# Patient Record
Sex: Female | Born: 1995 | Race: Black or African American | Hispanic: No | Marital: Single | Smoking: Former smoker
Health system: Southern US, Community
[De-identification: ages and names within clinical notes are randomized; demographics above are authoritative.]

## PROBLEM LIST (undated history)

## (undated) ENCOUNTER — Inpatient Hospital Stay (HOSPITAL_COMMUNITY): Payer: Self-pay

## (undated) DIAGNOSIS — A549 Gonococcal infection, unspecified: Secondary | ICD-10-CM

## (undated) DIAGNOSIS — A749 Chlamydial infection, unspecified: Secondary | ICD-10-CM

## (undated) DIAGNOSIS — J45909 Unspecified asthma, uncomplicated: Secondary | ICD-10-CM

## (undated) HISTORY — PX: WISDOM TOOTH EXTRACTION: SHX21

---
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
## 2016-05-04 ENCOUNTER — Encounter (HOSPITAL_COMMUNITY): Payer: Self-pay | Admitting: Emergency Medicine

## 2016-05-04 ENCOUNTER — Ambulatory Visit (HOSPITAL_COMMUNITY)
Admission: EM | Admit: 2016-05-04 | Discharge: 2016-05-04 | Disposition: A | Discharge status: Home / Self Care | Payer: 59 | Attending: Family Medicine | Admitting: Family Medicine

## 2016-05-04 DIAGNOSIS — Z113 Encounter for screening for infections with a predominantly sexual mode of transmission: Secondary | ICD-10-CM

## 2016-05-04 DIAGNOSIS — N898 Other specified noninflammatory disorders of vagina: Secondary | ICD-10-CM | POA: Diagnosis not present

## 2016-05-04 DIAGNOSIS — Z3202 Encounter for pregnancy test, result negative: Secondary | ICD-10-CM | POA: Diagnosis not present

## 2016-05-04 DIAGNOSIS — R110 Nausea: Secondary | ICD-10-CM

## 2016-05-04 DIAGNOSIS — Z202 Contact with and (suspected) exposure to infections with a predominantly sexual mode of transmission: Secondary | ICD-10-CM

## 2016-05-04 LAB — CBC WITH DIFFERENTIAL/PLATELET
Basophils Absolute: 0 10*3/uL (ref 0.0–0.1)
Basophils Relative: 0 %
EOS PCT: 1 %
Eosinophils Absolute: 0.1 10*3/uL (ref 0.0–0.7)
HEMATOCRIT: 41.5 % (ref 36.0–46.0)
HEMOGLOBIN: 13.6 g/dL (ref 12.0–15.0)
LYMPHS ABS: 3.3 10*3/uL (ref 0.7–4.0)
LYMPHS PCT: 43 %
MCH: 28.3 pg (ref 26.0–34.0)
MCHC: 32.8 g/dL (ref 30.0–36.0)
MCV: 86.5 fL (ref 78.0–100.0)
Monocytes Absolute: 0.4 10*3/uL (ref 0.1–1.0)
Monocytes Relative: 6 %
NEUTROS ABS: 3.8 10*3/uL (ref 1.7–7.7)
Neutrophils Relative %: 50 %
Platelets: 249 10*3/uL (ref 150–400)
RBC: 4.8 MIL/uL (ref 3.87–5.11)
RDW: 14.4 % (ref 11.5–15.5)
WBC: 7.6 10*3/uL (ref 4.0–10.5)

## 2016-05-04 LAB — POCT I-STAT, CHEM 8
BUN: 13 mg/dL (ref 6–20)
CHLORIDE: 105 mmol/L (ref 101–111)
CREATININE: 0.7 mg/dL (ref 0.44–1.00)
Calcium, Ion: 1.09 mmol/L — ABNORMAL LOW (ref 1.15–1.40)
GLUCOSE: 79 mg/dL (ref 65–99)
HEMATOCRIT: 44 % (ref 36.0–46.0)
HEMOGLOBIN: 15 g/dL (ref 12.0–15.0)
POTASSIUM: 4.2 mmol/L (ref 3.5–5.1)
Sodium: 138 mmol/L (ref 135–145)
TCO2: 25 mmol/L (ref 0–100)

## 2016-05-04 LAB — POCT PREGNANCY, URINE: Preg Test, Ur: NEGATIVE

## 2016-05-04 MED ORDER — METRONIDAZOLE 500 MG PO TABS
500.0000 mg | ORAL_TABLET | Freq: Two times a day (BID) | ORAL | 0 refills | Status: DC
Start: 2016-05-04 — End: 2016-06-06

## 2016-05-04 MED ORDER — STERILE WATER FOR INJECTION IJ SOLN
INTRAMUSCULAR | Status: AC
  Filled 2016-05-04: qty 10

## 2016-05-04 MED ORDER — PENICILLIN G BENZATHINE 1200000 UNIT/2ML IM SUSP
2.4000 10*6.[IU] | Freq: Once | INTRAMUSCULAR | Status: AC
  Administered 2016-05-04: 2.4 10*6.[IU] via INTRAMUSCULAR

## 2016-05-04 MED ORDER — PENICILLIN G BENZATHINE 1200000 UNIT/2ML IM SUSP
INTRAMUSCULAR | Status: AC
  Filled 2016-05-04: qty 2

## 2016-05-04 MED ORDER — CEFTRIAXONE SODIUM 250 MG IJ SOLR
250.0000 mg | Freq: Once | INTRAMUSCULAR | Status: AC
  Administered 2016-05-04: 250 mg via INTRAMUSCULAR

## 2016-05-04 MED ORDER — AZITHROMYCIN 250 MG PO TABS
1000.0000 mg | ORAL_TABLET | Freq: Once | ORAL | Status: AC
  Administered 2016-05-04: 1000 mg via ORAL

## 2016-05-04 MED ORDER — AZITHROMYCIN 250 MG PO TABS
ORAL_TABLET | ORAL | Status: AC
  Filled 2016-05-04: qty 4

## 2016-05-04 MED ORDER — CEFTRIAXONE SODIUM 250 MG IJ SOLR
INTRAMUSCULAR | Status: AC
  Filled 2016-05-04: qty 250

## 2016-05-04 NOTE — ED Provider Notes (Signed)
CSN: [id number]     Arrival date & time 05/04/16  1710 History   First MD Initiated Contact with Patient 05/04/16 1734     Chief Complaint  Patient presents with  . Exposure to STD   (Consider location/radiation/quality/duration/timing/severity/associated sxs/prior Treatment) 21 year old female presents to clinic with a chief complaint of possible exposure to syphilis. She states one of her previous partners has tested positive for syphilis. She states she wants to be "tested for everything" she states she has no vaginal discharge, or discomfort, however she has had some nausea recently, chills, and fatigue. She does not believe there is a possibility of pregnancy, and she takes Depo injections for birth control.   The history is provided by the patient.  Exposure to STD  The current episode started more than 2 days ago. The problem occurs rarely. The problem has not changed since onset.Pertinent negatives include no chest pain, no abdominal pain, no headaches and no shortness of breath. Nothing aggravates the symptoms. She has tried nothing for the symptoms.    Past Medical History:  Diagnosis Date  . Asthma    History reviewed. No pertinent surgical history. Family History  Problem Relation Age of Onset  . Hypertension Father    Social History  Substance Use Topics  . Smoking status: Never Smoker  . Smokeless tobacco: Never Used  . Alcohol use Yes     Comment: occasional   OB History    No data available     Review of Systems  Constitutional: Positive for chills and fatigue. Negative for appetite change and fever.  HENT: Negative.   Respiratory: Negative for shortness of breath.   Cardiovascular: Negative for chest pain, palpitations and leg swelling.  Gastrointestinal: Positive for nausea. Negative for abdominal pain, constipation and vomiting.  Genitourinary: Negative for difficulty urinating, dyspareunia, dysuria, flank pain, frequency, genital sores, pelvic pain,  urgency, vaginal discharge and vaginal pain.  Musculoskeletal: Negative for arthralgias, back pain, myalgias, neck pain and neck stiffness.  Skin: Negative for color change, pallor and wound.  Neurological: Negative for dizziness, syncope, weakness, numbness and headaches.  Hematological: Negative for adenopathy. Does not bruise/bleed easily.    Allergies  Patient has no known allergies.  Home Medications   Prior to Admission medications   Medication Sig Start Date End Date Taking? Authorizing Provider  albuterol (PROVENTIL HFA;VENTOLIN HFA) 108 (90 BASE) MCG/ACT inhaler Inhale 2 puffs into the lungs every 4 (four) hours as needed for wheezing or shortness of breath. 04/18/13  Yes Reuben Likes, MD  Norethin-Eth Estradiol-Fe (ZENCHENT FE PO) Take 1 tablet by mouth daily.   Yes Historical Provider, MD  desogestrel-ethinyl estradiol (APRI,EMOQUETTE,SOLIA) 0.15-30 MG-MCG tablet Take 1 tablet by mouth daily.    Historical Provider, MD  ibuprofen (ADVIL,MOTRIN) 800 MG tablet Take 800 mg by mouth every 8 (eight) hours as needed for mild pain.    Historical Provider, MD  medroxyPROGESTERone (DEPO-PROVERA) 150 MG/ML injection Inject 150 mg into the muscle every 3 (three) months.    Historical Provider, MD  metroNIDAZOLE (FLAGYL) 500 MG tablet Take 1 tablet (500 mg total) by mouth 2 (two) times daily. 05/04/16   Dorena Bodo, NP  naproxen (NAPROSYN) 500 MG tablet Take 1 tablet (500 mg total) by mouth 2 (two) times daily. 01/13/14   Antony Madura, PA-C  triamcinolone cream (KENALOG) 0.1 % Apply 1 application topically 2 (two) times daily. 05/16/14   Rodolph Bong, MD   Meds Ordered and Administered this Visit   Medications  penicillin g benzathine (BICILLIN LA) 1200000 UNIT/2ML injection 2.4 Million Units (2.4 Million Units Intramuscular Given 05/04/16 1815)  azithromycin (ZITHROMAX) tablet 1,000 mg (1,000 mg Oral Given 05/04/16 1813)  cefTRIAXone (ROCEPHIN) injection 250 mg (250 mg Intramuscular Given  05/04/16 1813)    BP 121/69   Pulse (!) 58   Temp 98.7 F (37.1 C) (Oral)   Resp 16   Ht  (1.6 m)   Wt 130 lb (59 kg)   SpO2 100%   BMI 23.03 kg/m  No data found.   Physical Exam  Constitutional: She is oriented to person, place, and time. She appears well-developed and well-nourished. No distress.  HENT:  Head: Normocephalic and atraumatic.  Right Ear: External ear normal.  Left Ear: External ear normal.  Cardiovascular: Normal rate and regular rhythm.   Pulmonary/Chest: Effort normal and breath sounds normal.  Abdominal: Soft. Bowel sounds are normal. She exhibits no distension. There is no tenderness. There is no guarding.  Genitourinary: Pelvic exam was performed with patient supine. There is no tenderness or lesion on the right labia. There is no tenderness or lesion on the left labia. Cervix exhibits discharge. Cervix exhibits no motion tenderness and no friability. Right adnexum displays no mass, no tenderness and no fullness. Left adnexum displays no mass, no tenderness and no fullness. No erythema or bleeding in the vagina. No foreign body in the vagina. Vaginal discharge found.  Genitourinary Comments: Thick, white, mucopurulent discharge found in vaginal area, along with the cervix. No lesions, rashes, or other external signs or symptoms of syphilis were noted on exam.  Lymphadenopathy:       Right: No inguinal adenopathy present.       Left: No inguinal adenopathy present.  Neurological: She is alert and oriented to person, place, and time.  Skin: Skin is warm and dry. Capillary refill takes less than 2 seconds. She is not diaphoretic.  Psychiatric: She has a normal mood and affect. Her behavior is normal.  Nursing note and vitals reviewed.   Urgent Care Course     Procedures (including critical care time)  Labs Review Labs Reviewed  POCT I-STAT, CHEM 8 - Abnormal; Notable for the following:       Result Value   Calcium, Ion 1.09 (*)    All other  components within normal limits  URINE CULTURE  CBC WITH DIFFERENTIAL/PLATELET  RPR  HIV ANTIBODY (ROUTINE TESTING)  POCT PREGNANCY, URINE  CERVICOVAGINAL ANCILLARY ONLY    Imaging Review No results found.     MDM   1. STD exposure    Pregnancy test negative, UA was unable to be run through the machine due to exceeding the limits of the equipment, positive for ketones, glucose, trace blood, negative nitrites, positive for protein, negative for leukocytes based off visual reading of the strip.  Treating empirically for syphilis, given injection of 2.4 million units of penicillin G, based on findings a physical exam also treating with ceftriaxone and Rocephin. We'll discharge home on prescription of metronidazole as well.  RPR, HIV, as well as samples sent for gonorrhea, chlamydia, and wet prep were sent to lab, CBC, and i-STAT also obtained as well. CBC and I-STAT findings were within normal limits except for calcium, which was 1.09.     Dorena Bodo, NP 05/04/16 2032

## 2016-05-04 NOTE — ED Triage Notes (Signed)
PT reports she had unprotected sex with someone who believes they now have syphilis. PT states, "I want to be tested for everything." PT is asymptomatic.

## 2016-05-04 NOTE — Discharge Instructions (Signed)
You have been treated today for syphilis, gonorrhea, chlamydia, samples that were collected today will be sent to lab for testing, and you will be notified of these results in 3-5 business days. I recommend safe sex practices rather abstinence, or use other barrier methods such as a condom. Also to cover for the possibility of bacterial vaginosis, I prescribed metronidazole, take one tablet twice a day for 7 days, and do not drink any alcohol while taking as this will make you very ill.  I have proved the contact information for a behavior health provider as you have requested. If at anytime you have any thoughts of self harm, please call 911, or go to the emergency room as soon as possible.

## 2016-05-04 NOTE — ED Notes (Signed)
Urinalysis test, urine levels exceeded the limits of the clinitex capabilities

## 2016-05-05 LAB — RPR: RPR Ser Ql: NONREACTIVE

## 2016-05-05 LAB — CERVICOVAGINAL ANCILLARY ONLY
Chlamydia: NEGATIVE
Neisseria Gonorrhea: NEGATIVE
Wet Prep (BD Affirm): NEGATIVE

## 2016-05-05 LAB — HIV ANTIBODY (ROUTINE TESTING W REFLEX): HIV SCREEN 4TH GENERATION: NONREACTIVE

## 2016-05-06 LAB — URINE CULTURE

## 2016-06-06 ENCOUNTER — Encounter (HOSPITAL_COMMUNITY): Payer: Self-pay | Admitting: Emergency Medicine

## 2016-06-06 ENCOUNTER — Ambulatory Visit (HOSPITAL_COMMUNITY)
Admission: EM | Admit: 2016-06-06 | Discharge: 2016-06-06 | Disposition: A | Discharge status: Home / Self Care | Payer: 59 | Attending: Family Medicine | Admitting: Family Medicine

## 2016-06-06 DIAGNOSIS — G44209 Tension-type headache, unspecified, not intractable: Secondary | ICD-10-CM

## 2016-06-06 MED ORDER — DEXAMETHASONE SODIUM PHOSPHATE 10 MG/ML IJ SOLN
INTRAMUSCULAR | Status: AC
  Filled 2016-06-06: qty 1

## 2016-06-06 MED ORDER — ONDANSETRON 4 MG PO TBDP
ORAL_TABLET | ORAL | Status: AC
  Filled 2016-06-06: qty 1

## 2016-06-06 MED ORDER — DEXAMETHASONE SODIUM PHOSPHATE 10 MG/ML IJ SOLN
10.0000 mg | Freq: Once | INTRAMUSCULAR | Status: AC
  Administered 2016-06-06: 10 mg via INTRAMUSCULAR

## 2016-06-06 MED ORDER — NAPROXEN 500 MG PO TABS: 500.0000 mg | ORAL_TABLET | Freq: Two times a day (BID) | ORAL | 0 refills | Status: DC

## 2016-06-06 MED ORDER — KETOROLAC TROMETHAMINE 60 MG/2ML IM SOLN
INTRAMUSCULAR | Status: AC
  Filled 2016-06-06: qty 2

## 2016-06-06 MED ORDER — ONDANSETRON 4 MG PO TBDP
4.0000 mg | ORAL_TABLET | Freq: Once | ORAL | Status: AC
  Administered 2016-06-06: 4 mg via ORAL

## 2016-06-06 MED ORDER — KETOROLAC TROMETHAMINE 60 MG/2ML IM SOLN
60.0000 mg | Freq: Once | INTRAMUSCULAR | Status: AC
  Administered 2016-06-06: 60 mg via INTRAMUSCULAR

## 2016-06-06 NOTE — ED Triage Notes (Signed)
The patient presented to the [facility] with a complaint of a headache x 2 days. 

## 2016-06-06 NOTE — ED Provider Notes (Signed)
CSN: [id number]     Arrival date & time 06/06/16  1937 History   None    Chief Complaint  Patient presents with  . Headache   (Consider location/radiation/quality/duration/timing/severity/associated sxs/prior Treatment) Patient c/o headache for last 2 days.  Patient c/o headache that wraps around, nausea, photophonophobia.   The history is provided by the patient.  Headache  Pain location:  Generalized Severity currently:  5/10 Severity at highest:  5/10 Onset quality:  Sudden Timing:  Constant Progression:  Worsening Chronicity:  New Context: activity   Relieved by:  Nothing Worsened by:  Nothing   Past Medical History:  Diagnosis Date  . Asthma    History reviewed. No pertinent surgical history. Family History  Problem Relation Age of Onset  . Hypertension Father    Social History  Substance Use Topics  . Smoking status: Never Smoker  . Smokeless tobacco: Never Used  . Alcohol use Yes     Comment: occasional   OB History    No data available     Review of Systems  Constitutional: Negative.   HENT: Negative.   Eyes: Negative.   Respiratory: Negative.   Cardiovascular: Negative.   Gastrointestinal: Negative.   Endocrine: Negative.   Genitourinary: Negative.   Musculoskeletal: Negative.   Allergic/Immunologic: Negative.   Neurological: Positive for headaches.  Hematological: Negative.   Psychiatric/Behavioral: Negative.     Allergies  Patient has no known allergies.  Home Medications   Prior to Admission medications   Medication Sig Start Date End Date Taking? Authorizing Provider  naproxen (NAPROSYN) 500 MG tablet Take 1 tablet (500 mg total) by mouth 2 (two) times daily with a meal. 06/06/16   Deatra CanterWilliam J Gaddiel Cullens, FNP  naproxen (NAPROSYN) 500 MG tablet Take 1 tablet (500 mg total) by mouth 2 (two) times daily with a meal. 06/06/16   Deatra CanterWilliam J Tristram Milian, FNP   Meds Ordered and Administered this Visit   Medications  ketorolac (TORADOL) injection 60 mg  (not administered)  ondansetron (ZOFRAN-ODT) disintegrating tablet 4 mg (not administered)  dexamethasone (DECADRON) injection 10 mg (not administered)    BP 124/68 (BP Location: Right Arm)   Pulse 90   Temp 98.3 F (36.8 C) (Oral)   Resp 18   SpO2 100%  No data found.   Physical Exam  Constitutional: She is oriented to person, place, and time. She appears well-developed and well-nourished.  HENT:  Head: Normocephalic and atraumatic.  Right Ear: External ear normal.  Left Ear: External ear normal.  Mouth/Throat: Oropharynx is clear and moist.  Eyes: Conjunctivae and EOM are normal. Pupils are equal, round, and reactive to light.  Neck: Normal range of motion. Neck supple.  Cardiovascular: Normal rate, regular rhythm and normal heart sounds.   Pulmonary/Chest: Effort normal and breath sounds normal.  Abdominal: Soft. Bowel sounds are normal.  Neurological: She is alert and oriented to person, place, and time.  Nursing note and vitals reviewed.   Urgent Care Course     Procedures (including critical care time)  Labs Review Labs Reviewed - No data to display  Imaging Review No results found.   Visual Acuity Review  Right Eye Distance:   Left Eye Distance:   Bilateral Distance:    Right Eye Near:   Left Eye Near:    Bilateral Near:         MDM   1. Tension headache    Toradol 60mg IM Zofran ODT 4mg  Dexamethasone 10mg  IM  Naprosyn 500mg  one po bid x 7  days #14      Deatra Canter, FNP 06/06/16 2112

## 2016-08-26 ENCOUNTER — Encounter (HOSPITAL_COMMUNITY): Payer: Self-pay | Admitting: Nurse Practitioner

## 2016-08-26 ENCOUNTER — Emergency Department (HOSPITAL_COMMUNITY): Payer: 59

## 2016-08-26 DIAGNOSIS — R079 Chest pain, unspecified: Secondary | ICD-10-CM | POA: Insufficient documentation

## 2016-08-26 DIAGNOSIS — R05 Cough: Secondary | ICD-10-CM | POA: Insufficient documentation

## 2016-08-26 DIAGNOSIS — R0602 Shortness of breath: Secondary | ICD-10-CM | POA: Diagnosis not present

## 2016-08-26 DIAGNOSIS — Z5321 Procedure and treatment not carried out due to patient leaving prior to being seen by health care provider: Secondary | ICD-10-CM | POA: Insufficient documentation

## 2016-08-26 LAB — BASIC METABOLIC PANEL
Anion gap: 6 (ref 5–15)
BUN: <5 mg/dL — ABNORMAL LOW (ref 6–20)
CALCIUM: 9.6 mg/dL (ref 8.9–10.3)
CO2: 23 mmol/L (ref 22–32)
CREATININE: 0.75 mg/dL (ref 0.44–1.00)
Chloride: 108 mmol/L (ref 101–111)
GFR calc Af Amer: >60 mL/min (ref >60)
GLUCOSE: 93 mg/dL (ref 65–99)
Potassium: 3.8 mmol/L (ref 3.5–5.1)
Sodium: 137 mmol/L (ref 135–145)

## 2016-08-26 LAB — I-STAT TROPONIN, ED: TROPONIN I, POC: 0 ng/mL (ref 0.00–0.08)

## 2016-08-26 LAB — CBC
HCT: 39.7 % (ref 36.0–46.0)
Hemoglobin: 13.4 g/dL (ref 12.0–15.0)
MCH: 28.6 pg (ref 26.0–34.0)
MCHC: 33.8 g/dL (ref 30.0–36.0)
MCV: 84.8 fL (ref 78.0–100.0)
Platelets: 283 10*3/uL (ref 150–400)
RBC: 4.68 MIL/uL (ref 3.87–5.11)
RDW: 14.2 % (ref 11.5–15.5)
WBC: 11.2 10*3/uL — ABNORMAL HIGH (ref 4.0–10.5)

## 2016-08-26 NOTE — ED Triage Notes (Signed)
Patient sts [place] Sunday [place] and noted she was having a cough, some shob and chest tightness. Pt sts symptoms have increased since being home. Pt endorses chest tightness when she takes a deep breath. Patient notes insect bite marks on arms, legs and chest that are itchy while she was in [place] but have spread since being here. Pt has not tried any OTC meds. Denies fevers, chills, rash, lightheadedness, dizziness, rashes. Pt In NAD.

## 2016-08-27 ENCOUNTER — Ambulatory Visit (HOSPITAL_COMMUNITY)
Admission: EM | Admit: 2016-08-27 | Discharge: 2016-08-27 | Disposition: A | Discharge status: Home / Self Care | Payer: 59 | Source: Home / Self Care | Attending: Family Medicine | Admitting: Family Medicine

## 2016-08-27 ENCOUNTER — Encounter (HOSPITAL_COMMUNITY): Payer: Self-pay | Admitting: Emergency Medicine

## 2016-08-27 ENCOUNTER — Emergency Department (HOSPITAL_COMMUNITY)
Admission: EM | Admit: 2016-08-27 | Discharge: 2016-08-27 | Disposition: A | Discharge status: Home / Self Care | Payer: 59 | Attending: Emergency Medicine | Admitting: Emergency Medicine

## 2016-08-27 DIAGNOSIS — J069 Acute upper respiratory infection, unspecified: Secondary | ICD-10-CM | POA: Diagnosis not present

## 2016-08-27 DIAGNOSIS — B9789 Other viral agents as the cause of diseases classified elsewhere: Secondary | ICD-10-CM | POA: Diagnosis not present

## 2016-08-27 MED ORDER — CETIRIZINE-PSEUDOEPHEDRINE ER 5-120 MG PO TB12: 1.0000 | ORAL_TABLET | Freq: Every day | ORAL | 0 refills | Status: DC

## 2016-08-27 MED ORDER — HYDROCOD POLST-CPM POLST ER 10-8 MG/5ML PO SUER: 5.0000 mL | Freq: Every evening | ORAL | 0 refills | Status: DC | PRN

## 2016-08-27 MED ORDER — FLUTICASONE PROPIONATE 50 MCG/ACT NA SUSP: 2.0000 | Freq: Every day | NASAL | 0 refills | Status: DC

## 2016-08-27 NOTE — ED Notes (Signed)
No answer when called  X 3 for reevaluayion

## 2016-08-27 NOTE — ED Provider Notes (Signed)
CSN: [id number]     Arrival date & time 08/27/16  1004 History   None    Chief Complaint  Patient presents with  . Cough   (Consider location/radiation/quality/duration/timing/severity/associated sxs/prior Treatment) 21 year old female with history of asthma comes in with 3 day history of cough, fever, nasal congestion. She just returned from a trip to [place]. States she has a lot of bug bites that itches. She went to the emergency department yesterday, and had chest x-rays and lab work done. However, she left prior to seeing a provider. She had a fever of 101 yesterday, took Tylenol for it, has been afebrile since. States nasal congestion is slowly decreasing, but with right ear pain now. She also experiences some nausea, loss of appetite. Denies chills, night sweats, abdominal pain, vomiting, diarrhea, constipation. Denies eye pain. Denies shortness of breath, trouble breathing. Does state that she has some wheezing.      Past Medical History:  Diagnosis Date  . Asthma    History reviewed. No pertinent surgical history. Family History  Problem Relation Age of Onset  . Hypertension Father    Social History  Substance Use Topics  . Smoking status: Never Smoker  . Smokeless tobacco: Never Used  . Alcohol use Yes     Comment: occasional   OB History    No data available     Review of Systems  Reason unable to perform ROS: See HPI as above.    Allergies  Patient has no known allergies.  Home Medications   Prior to Admission medications   Medication Sig Start Date End Date Taking? Authorizing Provider  cetirizine-pseudoephedrine (ZYRTEC-D) 5-120 MG tablet Take 1 tablet by mouth daily. 08/27/16   Cathie HoopsYu, Amy V, PA-C  chlorpheniramine-HYDROcodone (TUSSIONEX PENNKINETIC ER) 10-8 MG/5ML SUER Take 5 mLs by mouth at bedtime as needed for cough. 08/27/16   Cathie HoopsYu, Amy V, PA-C  fluticasone (FLONASE) 50 MCG/ACT nasal spray Place 2 sprays into both nostrils daily. 08/27/16   Cathie HoopsYu, Amy  V, PA-C  naproxen (NAPROSYN) 500 MG tablet Take 1 tablet (500 mg total) by mouth 2 (two) times daily with a meal. 06/06/16   Oxford, Anselm PancoastWilliam J, FNP  naproxen (NAPROSYN) 500 MG tablet Take 1 tablet (500 mg total) by mouth 2 (two) times daily with a meal. 06/06/16   Oxford, Anselm PancoastWilliam J, FNP   Meds Ordered and Administered this Visit  Medications - No data to display  BP 112/68 (BP Location: Left Arm)   Pulse (!) 115   Temp 99.2 F (37.3 C) (Oral)   Resp 18   SpO2 98%  No data found.   Physical Exam  Constitutional: She is oriented to person, place, and time. She appears well-developed and well-nourished. No distress.  HENT:  Head: Normocephalic and atraumatic.  Right Ear: External ear and ear canal normal. Tympanic membrane is not erythematous and not bulging. A middle ear effusion is present.  Left Ear: External ear and ear canal normal. Tympanic membrane is not erythematous and not bulging. A middle ear effusion is present.  Nose: Nose normal. Right sinus exhibits no maxillary sinus tenderness and no frontal sinus tenderness. Left sinus exhibits no maxillary sinus tenderness and no frontal sinus tenderness.  Mouth/Throat: Uvula is midline, oropharynx is clear and moist and mucous membranes are normal. Tonsils are 1+ on the right. Tonsils are 1+ on the left.  Eyes: Pupils are equal, round, and reactive to light. Conjunctivae are normal.  Neck: Normal range of motion. Neck supple.  Cardiovascular:  Normal rate, regular rhythm and normal heart sounds.  Exam reveals no gallop and no friction rub.   No murmur heard. Pulmonary/Chest: Effort normal and breath sounds normal. She has no decreased breath sounds. She has no wheezes. She has no rhonchi. She has no rales.  Lymphadenopathy:    She has no cervical adenopathy.  Neurological: She is alert and oriented to person, place, and time.  Skin: Skin is warm and dry.  Psychiatric: She has a normal mood and affect. Her behavior is normal. Judgment  normal.    Urgent Care Course     Procedures (including critical care time)  Labs Review Labs Reviewed - No data to display  Imaging Review Dg Chest 2 View  Result Date: 08/26/2016 CLINICAL DATA:  Shortness of breath and chest pain EXAM: CHEST  2 VIEW COMPARISON:  None. FINDINGS: The heart size and mediastinal contours are within normal limits. Both lungs are clear. The visualized skeletal structures are unremarkable. IMPRESSION: No active cardiopulmonary disease. Electronically Signed   By: Alcide CleverMark  Lukens M.D.   On: 08/26/2016 21:32          MDM   1. Viral URI with cough    Discussed x-ray results and lab work from yesterday with patient. Chest x-ray negative for active cardiopulmonary disease. CBC shows slightly elevated white blood cell counts. Given patient's history and exam, discussed with patient most likely due to viral URI. Patient to start Zyrtec-D and Flonase for nasal congestion. Can take Tussionex at night for cough. Patient to continue Tylenol/Motrin for fever and pain. Discussed with patient given no wheezing on examination, O2 sat are 98% at room air, no indication of albuterol inhaler. Patient to push fluids. Follow-up for any worsening of symptoms, trouble breathing, wheezing, shortness of breath, to follow-up for reevaluation.   Belinda FisherYu, Amy V, PA-C 08/27/16 1048

## 2016-08-27 NOTE — ED Triage Notes (Signed)
[facility], patient did have a chest xray, but was unable to stay to see a physician.   [place].  Cough, fatigue, insect bites and chest pain progressively worsened since arriving back home.

## 2016-08-27 NOTE — Discharge Instructions (Signed)
Your chest x ray from yesterday was negative. Start Zytec-D and flonase for nasal congestion. Use Tussionex as needed at night for cough. Continue tylenol/motrin for pain and fever. Drink lots of water, your urine should be clear/pale yellow color. Monitor for worsening of symptoms, shortness of breath, trouble breathing, to follow up for further evaluation.

## 2017-07-02 ENCOUNTER — Ambulatory Visit (HOSPITAL_COMMUNITY)
Admission: EM | Admit: 2017-07-02 | Discharge: 2017-07-02 | Disposition: A | Discharge status: Home / Self Care | Payer: 59 | Attending: Family Medicine | Admitting: Family Medicine

## 2017-07-02 ENCOUNTER — Encounter (HOSPITAL_COMMUNITY): Payer: Self-pay | Admitting: Emergency Medicine

## 2017-07-02 DIAGNOSIS — Z202 Contact with and (suspected) exposure to infections with a predominantly sexual mode of transmission: Secondary | ICD-10-CM | POA: Insufficient documentation

## 2017-07-02 DIAGNOSIS — Z113 Encounter for screening for infections with a predominantly sexual mode of transmission: Secondary | ICD-10-CM

## 2017-07-02 DIAGNOSIS — Z3202 Encounter for pregnancy test, result negative: Secondary | ICD-10-CM | POA: Diagnosis not present

## 2017-07-02 LAB — POCT PREGNANCY, URINE: Preg Test, Ur: NEGATIVE

## 2017-07-02 MED ORDER — CEFTRIAXONE SODIUM 250 MG IJ SOLR
INTRAMUSCULAR | Status: AC
Start: 2017-07-02 — End: ?
  Filled 2017-07-02: qty 250

## 2017-07-02 MED ORDER — CEFTRIAXONE SODIUM 250 MG IJ SOLR
250.0000 mg | Freq: Once | INTRAMUSCULAR | Status: AC
  Administered 2017-07-02: 250 mg via INTRAMUSCULAR

## 2017-07-02 MED ORDER — AZITHROMYCIN 250 MG PO TABS
1000.0000 mg | ORAL_TABLET | Freq: Once | ORAL | Status: AC
  Administered 2017-07-02: 1000 mg via ORAL

## 2017-07-02 MED ORDER — STERILE WATER FOR INJECTION IJ SOLN
INTRAMUSCULAR | Status: AC
  Filled 2017-07-02: qty 10

## 2017-07-02 MED ORDER — AZITHROMYCIN 250 MG PO TABS
ORAL_TABLET | ORAL | Status: AC
Start: 2017-07-02 — End: ?
  Filled 2017-07-02: qty 4

## 2017-07-02 NOTE — ED Triage Notes (Signed)
Pt was told she had possible exposure to chlamydia. Denies symptoms.

## 2017-07-02 NOTE — ED Provider Notes (Signed)
[facility]    CSN: [id number] Arrival date & time: 07/02/17  1541     History   Chief Complaint Chief Complaint  Patient presents with  . Exposure to STD    HPI Sandra Morris is a 22 y.o. female.   22 year old female comes in for exposure to chlamydia.  States that her current partner called her yesterday about diagnosis.  She denies any symptoms.  Denies  vaginal discharge, itching, pain.  Denies urinary symptoms such as frequency, dysuria, hematuria.  Denies fever, chills, night sweats.  Has some abdominal cramping that has since resolved, denies nausea, vomiting.  LMP 06/12/2017.  She is sexually active with one female partner, no condom use.  No other birth control use.     Past Medical History:  Diagnosis Date  . Asthma     There are no active problems to display for this patient.   History reviewed. No pertinent surgical history.  OB History   None      Home Medications    Prior to Admission medications   Medication Sig Start Date End Date Taking? Authorizing Provider  cetirizine-pseudoephedrine (ZYRTEC-D) 5-120 MG tablet Take 1 tablet by mouth daily. Patient not taking: Reported on 07/02/2017 08/27/16   Belinda Fisher, PA-C  chlorpheniramine-HYDROcodone [facility] PENNKINETIC ER) 10-8 MG/5ML SUER Take 5 mLs by mouth at bedtime as needed for cough. Patient not taking: Reported on 07/02/2017 08/27/16   Belinda Fisher, PA-C  fluticasone [facility]) 50 MCG/ACT nasal spray Place 2 sprays into both nostrils daily. Patient not taking: Reported on 07/02/2017 08/27/16   Belinda Fisher, PA-C  naproxen (NAPROSYN) 500 MG tablet Take 1 tablet (500 mg total) by mouth 2 (two) times daily with a meal. Patient not taking: Reported on 07/02/2017 06/06/16   Deatra Canter, FNP  naproxen (NAPROSYN) 500 MG tablet Take 1 tablet (500 mg total) by mouth 2 (two) times daily with a meal. Patient not taking: Reported on 07/02/2017 06/06/16   Deatra Canter, FNP    Family History Family  History  Problem Relation Age of Onset  . Hypertension Father     Social History Social History   Tobacco Use  . Smoking status: Never Smoker  . Smokeless tobacco: Never Used  Substance Use Topics  . Alcohol use: Yes    Comment: occasional  . Drug use: No     Allergies   Patient has no known allergies.   Review of Systems Review of Systems  Reason unable to perform ROS: See HPI as above.     Physical Exam Triage Vital Signs ED Triage Vitals [07/02/17 1552]  Enc Vitals Group     BP 126/79     Pulse Rate (!) 105     Resp 18     Temp 98.4 F (36.9 C)     Temp src      SpO2 100 %     Weight      Height      Head Circumference      Peak Flow      Pain Score      Pain Loc      Pain Edu?      Excl. in GC?    No data found.  Updated Vital Signs BP 126/79   Pulse (!) 105   Temp 98.4 F (36.9 C)   Resp 18   LMP 06/02/2017   SpO2 100%    Physical Exam  Constitutional: She is oriented to person,  place, and time. She appears well-developed and well-nourished. No distress.  HENT:  Head: Normocephalic and atraumatic.  Eyes: Pupils are equal, round, and reactive to light. Conjunctivae are normal.  Cardiovascular: Normal rate, regular rhythm and normal heart sounds. Exam reveals no gallop and no friction rub.  No murmur heard. Pulmonary/Chest: Effort normal and breath sounds normal. She has no wheezes. She has no rales.  Abdominal: Soft. Bowel sounds are normal. She exhibits no mass. There is no tenderness. There is no rebound, no guarding and no CVA tenderness.  Neurological: She is alert and oriented to person, place, and time.  Skin: Skin is warm and dry.  Psychiatric: She has a normal mood and affect. Her behavior is normal. Judgment normal.     UC Treatments / Results  Labs (all labs ordered are listed, but only abnormal results are displayed) Labs Reviewed  POCT PREGNANCY, URINE  URINE CYTOLOGY ANCILLARY ONLY    EKG None  Radiology No results  found.  Procedures Procedures (including critical care time)  Medications Ordered in UC Medications  azithromycin (ZITHROMAX) tablet 1,000 mg (1,000 mg Oral Given 07/02/17 1645)  cefTRIAXone (ROCEPHIN) injection 250 mg (250 mg Intramuscular Given 07/02/17 1645)    Initial Impression / Assessment and Plan / UC Course  I have reviewed the triage vital signs and the nursing notes.  Pertinent labs & imaging results that were available during my care of the patient were reviewed by me and considered in my medical decision making (see chart for details).    Patient was treated empirically for gonorrhea, chlamydia. Azithromycin and Rocephin given in office today. Cytology sent, patient will be contacted with any positive results that require additional treatment. Patient to refrain from sexual activity for the next 7 days. Return precautions given.   Final Clinical Impressions(s) / UC Diagnoses   Final diagnoses:  Exposure to STD    ED Prescriptions    None       Belinda Fisher, PA-C 07/02/17 1654

## 2017-07-02 NOTE — Discharge Instructions (Signed)
You were treated empirically for gonorrhea and chlamydia. Azithromycin 1g by mouth and Rocephin 250mg injection given in office today. Cytology sent, you will be contacted with any positive results that requires further treatment. Refrain from sexual activity and alcohol use for the next 7 days. Monitor for any worsening of symptoms, fever, abdominal pain, nausea, vomiting, to follow up for reevaluation. ° °

## 2017-07-03 LAB — URINE CYTOLOGY ANCILLARY ONLY
Chlamydia: NEGATIVE
Neisseria Gonorrhea: NEGATIVE
Trichomonas: NEGATIVE

## 2017-07-07 LAB — URINE CYTOLOGY ANCILLARY ONLY: Candida vaginitis: NEGATIVE

## 2017-07-08 ENCOUNTER — Telehealth (HOSPITAL_COMMUNITY): Payer: Self-pay

## 2017-07-08 NOTE — Telephone Encounter (Signed)
Bacterial vaginosis is positive. This was not treated at the urgent care visit.  Attempted to reach patient to assess the need for Flagyl. No answer at this time. Voicemail left. Message sent to MyChart.

## 2017-08-01 ENCOUNTER — Other Ambulatory Visit: Payer: Self-pay

## 2017-08-01 ENCOUNTER — Encounter (HOSPITAL_COMMUNITY): Payer: Self-pay | Admitting: *Deleted

## 2017-08-01 ENCOUNTER — Ambulatory Visit (HOSPITAL_COMMUNITY)
Admission: EM | Admit: 2017-08-01 | Discharge: 2017-08-01 | Disposition: A | Discharge status: Home / Self Care | Payer: 59 | Attending: Family Medicine | Admitting: Family Medicine

## 2017-08-01 DIAGNOSIS — Z113 Encounter for screening for infections with a predominantly sexual mode of transmission: Secondary | ICD-10-CM

## 2017-08-01 DIAGNOSIS — M5489 Other dorsalgia: Secondary | ICD-10-CM

## 2017-08-01 DIAGNOSIS — Z3202 Encounter for pregnancy test, result negative: Secondary | ICD-10-CM

## 2017-08-01 DIAGNOSIS — J45909 Unspecified asthma, uncomplicated: Secondary | ICD-10-CM | POA: Diagnosis not present

## 2017-08-01 DIAGNOSIS — Z8249 Family history of ischemic heart disease and other diseases of the circulatory system: Secondary | ICD-10-CM | POA: Diagnosis not present

## 2017-08-01 DIAGNOSIS — Z202 Contact with and (suspected) exposure to infections with a predominantly sexual mode of transmission: Secondary | ICD-10-CM | POA: Insufficient documentation

## 2017-08-01 LAB — POCT URINALYSIS DIP (DEVICE)
BILIRUBIN URINE: NEGATIVE
GLUCOSE, UA: NEGATIVE mg/dL
Hgb urine dipstick: NEGATIVE
KETONES UR: NEGATIVE mg/dL
Leukocytes, UA: NEGATIVE
Nitrite: NEGATIVE
PH: 5.5 (ref 5.0–8.0)
Protein, ur: NEGATIVE mg/dL
Specific Gravity, Urine: >=1.03 (ref 1.005–1.030)
Urobilinogen, UA: 0.2 mg/dL (ref 0.0–1.0)

## 2017-08-01 LAB — POCT PREGNANCY, URINE: PREG TEST UR: NEGATIVE

## 2017-08-01 MED ORDER — AZITHROMYCIN 250 MG PO TABS
1000.0000 mg | ORAL_TABLET | Freq: Once | ORAL | Status: AC
  Administered 2017-08-01: 1000 mg via ORAL

## 2017-08-01 MED ORDER — AZITHROMYCIN 250 MG PO TABS
ORAL_TABLET | ORAL | Status: AC
  Filled 2017-08-01: qty 4

## 2017-08-01 NOTE — ED Provider Notes (Signed)
[facility]    CSN: [id number] Arrival date & time: 08/01/17  1554     History   Chief Complaint Chief Complaint  Patient presents with  . Exposure to STD    HPI Sandra Morris is a 22 y.o. female history of asthma presenting today for STD screening.  Patient denies any symptoms, she does endorse some mild back pain, but believes that this is related to her menstrual cycle about to start.  She states that she has had a new partner in the past month and he has started to develop symptoms.  He is here today also to be screened and checked, she states that he is being empirically treated and she wishes to do so as well for chlamydia, but declines this shot.  She denies any pelvic pain, rashes or lesions, abnormal vaginal discharge.  Patient uses Depo for birth control.  HPI  Past Medical History:  Diagnosis Date  . Asthma     There are no active problems to display for this patient.   Past Surgical History:  Procedure Laterality Date  . WISDOM TOOTH EXTRACTION      OB History   None      Home Medications    Prior to Admission medications   Medication Sig Start Date End Date Taking? Authorizing Provider  cetirizine-pseudoephedrine (ZYRTEC-D) 5-120 MG tablet Take 1 tablet by mouth daily. Patient not taking: Reported on 07/02/2017 08/27/16   Belinda Fisher, PA-C  chlorpheniramine-HYDROcodone [facility] PENNKINETIC ER) 10-8 MG/5ML SUER Take 5 mLs by mouth at bedtime as needed for cough. Patient not taking: Reported on 07/02/2017 08/27/16   Belinda Fisher, PA-C  fluticasone [facility]) 50 MCG/ACT nasal spray Place 2 sprays into both nostrils daily. Patient not taking: Reported on 07/02/2017 08/27/16   Belinda Fisher, PA-C  naproxen (NAPROSYN) 500 MG tablet Take 1 tablet (500 mg total) by mouth 2 (two) times daily with a meal. Patient not taking: Reported on 07/02/2017 06/06/16   Deatra Canter, FNP  naproxen (NAPROSYN) 500 MG tablet Take 1 tablet (500 mg total) by mouth 2 (two)  times daily with a meal. Patient not taking: Reported on 07/02/2017 06/06/16   Deatra Canter, FNP    Family History Family History  Problem Relation Age of Onset  . Hypertension Father     Social History Social History   Tobacco Use  . Smoking status: Never Smoker  . Smokeless tobacco: Never Used  Substance Use Topics  . Alcohol use: Yes    Comment: occasional  . Drug use: No     Allergies   Patient has no known allergies.   Review of Systems Review of Systems  Constitutional: Negative for fever.  Respiratory: Negative for shortness of breath.   Cardiovascular: Negative for chest pain.  Gastrointestinal: Negative for abdominal pain, diarrhea, nausea and vomiting.  Genitourinary: Negative for dysuria, flank pain, genital sores, hematuria, menstrual problem, vaginal bleeding, vaginal discharge and vaginal pain.  Musculoskeletal: Positive for back pain.  Skin: Negative for rash.  Neurological: Negative for dizziness, light-headedness and headaches.     Physical Exam Triage Vital Signs ED Triage Vitals  Enc Vitals Group     BP 08/01/17 1637 128/78     Pulse Rate 08/01/17 1637 66     Resp 08/01/17 1637 18     Temp 08/01/17 1637 98.3 F (36.8 C)     Temp Source 08/01/17 1637 Oral     SpO2 08/01/17 1637 100 %  Weight --      Height --      Head Circumference --      Peak Flow --      Pain Score 08/01/17 1638 0     Pain Loc --      Pain Edu? --      Excl. in GC? --    No data found.  Updated Vital Signs BP 128/78 (BP Location: Right Arm)   Pulse 66   Temp 98.3 F (36.8 C) (Oral)   Resp 18   LMP 07/04/2017   SpO2 100%   Visual Acuity Right Eye Distance:   Left Eye Distance:   Bilateral Distance:    Right Eye Near:   Left Eye Near:    Bilateral Near:     Physical Exam  Constitutional: She is oriented to person, place, and time. She appears well-developed and well-nourished. No distress.  HENT:  Head: Normocephalic and atraumatic.  Eyes:  Conjunctivae are normal.  Neck: Neck supple.  Cardiovascular: Normal rate and regular rhythm.  No murmur heard. Pulmonary/Chest: Effort normal and breath sounds normal. No respiratory distress.  Abdominal: Soft. There is no tenderness.  Genitourinary:  Genitourinary Comments: Deferred  Musculoskeletal: She exhibits no edema.  Neurological: She is alert and oriented to person, place, and time.  Skin: Skin is warm and dry.  Psychiatric: She has a normal mood and affect.  Nursing note and vitals reviewed.    UC Treatments / Results  Labs (all labs ordered are listed, but only abnormal results are displayed) Labs Reviewed  POCT URINALYSIS DIP (DEVICE)  POCT PREGNANCY, URINE  CERVICOVAGINAL ANCILLARY ONLY    EKG None  Radiology No results found.  Procedures Procedures (including critical care time)  Medications Ordered in UC Medications  azithromycin (ZITHROMAX) tablet 1,000 mg (1,000 mg Oral Given 08/01/17 1736)    Initial Impression / Assessment and Plan / UC Course  I have reviewed the triage vital signs and the nursing notes.  Pertinent labs & imaging results that were available during my care of the patient were reviewed by me and considered in my medical decision making (see chart for details).     UA unremarkable, pregnancy test negative.  Vaginal swab obtained to send off for STDs as well as yeast and BV.  We will go ahead and empirically treat for chlamydia today.  Will call patient with results and alter treatment as needed.Discussed strict return precautions. Patient verbalized understanding and is agreeable with plan.  Final Clinical Impressions(s) / UC Diagnoses   Final diagnoses:  Screen for STD (sexually transmitted disease)     Discharge Instructions     Please refrain from sexual intercourse for 7 days while medicines eliminating infection.   We are testing you for Gonorrhea, Chlamydia, Trichomonas, Yeast and Bacterial Vaginosis. We will call you  if anything is positive and let you know if you require any further treatment. Please inform partners of any positive results.   Please return if symptoms not improving with treatment, development of fever, nausea, vomiting, abdominal pain.    ED Prescriptions    None     Controlled Substance Prescriptions [place] Controlled Substance Registry consulted? Not Applicable   Lew DawesWieters, Erie Sica C, [place]PA-C 08/01/17 1744

## 2017-08-01 NOTE — ED Triage Notes (Signed)
States she wants to be checked for STD states she has a new partner and wants to make sure everything is ok.

## 2017-08-01 NOTE — Discharge Instructions (Signed)
Please refrain from sexual intercourse for 7 days while medicines eliminating infection.   We are testing you for Gonorrhea, Chlamydia, Trichomonas, Yeast and Bacterial Vaginosis. We will call you if anything is positive and let you know if you require any further treatment. Please inform partners of any positive results.   Please return if symptoms not improving with treatment, development of fever, nausea, vomiting, abdominal pain.

## 2017-08-03 ENCOUNTER — Encounter (HOSPITAL_COMMUNITY): Payer: Self-pay

## 2017-08-03 ENCOUNTER — Telehealth (HOSPITAL_COMMUNITY): Payer: Self-pay

## 2017-08-03 ENCOUNTER — Ambulatory Visit (HOSPITAL_COMMUNITY)
Admission: EM | Admit: 2017-08-03 | Discharge: 2017-08-03 | Disposition: A | Discharge status: Home / Self Care | Payer: 59 | Attending: Family Medicine | Admitting: Family Medicine

## 2017-08-03 DIAGNOSIS — A549 Gonococcal infection, unspecified: Secondary | ICD-10-CM | POA: Diagnosis not present

## 2017-08-03 LAB — CERVICOVAGINAL ANCILLARY ONLY
BACTERIAL VAGINITIS: POSITIVE — AB
CANDIDA VAGINITIS: POSITIVE — AB
CHLAMYDIA, DNA PROBE: NEGATIVE
Neisseria Gonorrhea: POSITIVE — AB
TRICH (WINDOWPATH): NEGATIVE

## 2017-08-03 MED ORDER — CEFTRIAXONE SODIUM 250 MG IJ SOLR
250.0000 mg | Freq: Once | INTRAMUSCULAR | Status: AC
  Administered 2017-08-03: 250 mg via INTRAMUSCULAR

## 2017-08-03 MED ORDER — METRONIDAZOLE 500 MG PO TABS: 500.0000 mg | ORAL_TABLET | Freq: Two times a day (BID) | ORAL | 0 refills | Status: DC

## 2017-08-03 MED ORDER — FLUCONAZOLE 150 MG PO TABS: 150.0000 mg | ORAL_TABLET | Freq: Every day | ORAL | 0 refills | Status: AC

## 2017-08-03 MED ORDER — LIDOCAINE HCL (PF) 1 % IJ SOLN
INTRAMUSCULAR | Status: AC
  Filled 2017-08-03: qty 2

## 2017-08-03 MED ORDER — CEFTRIAXONE SODIUM 250 MG IJ SOLR
INTRAMUSCULAR | Status: AC
Start: 2017-08-03 — End: ?
  Filled 2017-08-03: qty 250

## 2017-08-03 NOTE — Telephone Encounter (Signed)
Bacterial vaginosis is positive. This was not treated at the urgent care visit.  Patient complains of persistent symptoms.  Flagyl 500 mg BID x 7 days #14 no refills sent to patients pharmacy of choice per Dr. Dayton ScrapeMurray.  Pt called and made aware of results and new prescription. Answered all questions and pt verbalized understanding.    Pt contacted regarding test for candida (yeast) was positive.  Prescription for fluconazole 150mg  po now, repeat dose in 3d if needed, #2 no refills, sent to the pharmacy of record.  Recheck or followup with PCP for further evaluation if symptoms are not improving.  Answered all questions.  Gonorrhea is positive.  Patient should return as soon as possible to the urgent care for treatment with IM rocephin 250mg  and po zithromax 1g. Patient will not need to see a provider unless there are new symptoms she would like evaluated. Pt called and made aware, educated patient to refrain from sexual intercourse for now and for 7 days after treatment to give the medicine time to work. Sexual partners need to be notified and tested/treated. Condoms may reduce risk of reinfection. Answered all patient questions. [facility] notified.

## 2017-08-03 NOTE — ED Triage Notes (Signed)
Pt presents to day for follow up from 6/29 Positive STD Screening

## 2017-08-24 ENCOUNTER — Ambulatory Visit (HOSPITAL_COMMUNITY)
Admission: EM | Admit: 2017-08-24 | Discharge: 2017-08-24 | Disposition: A | Discharge status: Home / Self Care | Payer: 59 | Attending: Family Medicine | Admitting: Family Medicine

## 2017-08-24 ENCOUNTER — Encounter (HOSPITAL_COMMUNITY): Payer: Self-pay | Admitting: Emergency Medicine

## 2017-08-24 ENCOUNTER — Ambulatory Visit (INDEPENDENT_AMBULATORY_CARE_PROVIDER_SITE_OTHER): Payer: 59

## 2017-08-24 DIAGNOSIS — Z3202 Encounter for pregnancy test, result negative: Secondary | ICD-10-CM | POA: Diagnosis not present

## 2017-08-24 DIAGNOSIS — J45909 Unspecified asthma, uncomplicated: Secondary | ICD-10-CM | POA: Diagnosis not present

## 2017-08-24 DIAGNOSIS — X501XXA Overexertion from prolonged static or awkward postures, initial encounter: Secondary | ICD-10-CM | POA: Diagnosis not present

## 2017-08-24 DIAGNOSIS — M25571 Pain in right ankle and joints of right foot: Secondary | ICD-10-CM

## 2017-08-24 DIAGNOSIS — Z202 Contact with and (suspected) exposure to infections with a predominantly sexual mode of transmission: Secondary | ICD-10-CM | POA: Diagnosis not present

## 2017-08-24 DIAGNOSIS — S93401A Sprain of unspecified ligament of right ankle, initial encounter: Secondary | ICD-10-CM | POA: Diagnosis not present

## 2017-08-24 DIAGNOSIS — Z113 Encounter for screening for infections with a predominantly sexual mode of transmission: Secondary | ICD-10-CM | POA: Diagnosis not present

## 2017-08-24 LAB — POCT URINALYSIS DIP (DEVICE)
BILIRUBIN URINE: NEGATIVE
Glucose, UA: NEGATIVE mg/dL
HGB URINE DIPSTICK: NEGATIVE
KETONES UR: NEGATIVE mg/dL
LEUKOCYTES UA: NEGATIVE
Nitrite: NEGATIVE
Protein, ur: NEGATIVE mg/dL
Urobilinogen, UA: 0.2 mg/dL (ref 0.0–1.0)
pH: 5.5 (ref 5.0–8.0)

## 2017-08-24 LAB — POCT PREGNANCY, URINE: Preg Test, Ur: NEGATIVE

## 2017-08-24 MED ORDER — AZITHROMYCIN 250 MG PO TABS
1000.0000 mg | ORAL_TABLET | Freq: Once | ORAL | Status: AC
Start: 2017-08-24 — End: 2017-08-24
  Administered 2017-08-24: 1000 mg via ORAL

## 2017-08-24 MED ORDER — CEFTRIAXONE SODIUM 250 MG IJ SOLR
INTRAMUSCULAR | Status: AC
  Filled 2017-08-24: qty 250

## 2017-08-24 MED ORDER — CEFTRIAXONE SODIUM 250 MG IJ SOLR
250.0000 mg | Freq: Once | INTRAMUSCULAR | Status: AC
  Administered 2017-08-24: 250 mg via INTRAMUSCULAR

## 2017-08-24 MED ORDER — FLUCONAZOLE 150 MG PO TABS: 150.0000 mg | ORAL_TABLET | Freq: Once | ORAL | 0 refills | Status: AC

## 2017-08-24 MED ORDER — AZITHROMYCIN 250 MG PO TABS
ORAL_TABLET | ORAL | Status: AC
Start: 2017-08-24 — End: ?
  Filled 2017-08-24: qty 4

## 2017-08-24 MED ORDER — LIDOCAINE HCL (PF) 1 % IJ SOLN
INTRAMUSCULAR | Status: AC
Start: 2017-08-24 — End: ?
  Filled 2017-08-24: qty 2

## 2017-08-24 MED ORDER — METRONIDAZOLE 500 MG PO TABS: 500.0000 mg | ORAL_TABLET | Freq: Two times a day (BID) | ORAL | 0 refills | Status: AC

## 2017-08-24 NOTE — Discharge Instructions (Signed)
We have treated you today for gonorrhea and chlamydia, with rocephin and azithromycin. Please refrain from sexual intercourse for 7 days while medicines eliminating infection.   We are testing you for Gonorrhea, Chlamydia, Trichomonas, Yeast and Bacterial Vaginosis. We will call you if anything is positive and let you know if you require any further treatment. Please inform partners of any positive results.   Please return if symptoms not improving with treatment, development of fever, nausea, vomiting, abdominal pain.   Use anti-inflammatories for pain/swelling. You may take up to 800 mg Ibuprofen every 8 hours with food. You may supplement Ibuprofen with Tylenol [phone] mg every 8 hours.   Ice and elevate ankle

## 2017-08-24 NOTE — ED Triage Notes (Signed)
PT reports she and partner were treated for STDs, but only waited 5 days to have intercourse again. PT is having vaginal dryness and itching, her boyfriend saw penile drainage.  Also has right ankle pain since Friday.

## 2017-08-25 LAB — CERVICOVAGINAL ANCILLARY ONLY
Bacterial vaginitis: POSITIVE — AB
Candida vaginitis: POSITIVE — AB
Chlamydia: NEGATIVE
NEISSERIA GONORRHEA: NEGATIVE
Trichomonas: NEGATIVE

## 2017-08-25 NOTE — ED Provider Notes (Signed)
[facility]    CSN: [id number] Arrival date & time: 08/24/17  1849     History   Chief Complaint Chief Complaint  Patient presents with  . SEXUALLY TRANSMITTED DISEASE  . Ankle Pain    HPI Sandra Morris is a 22 y.o. female history of asthma presenting today for STD screening as well as ankle pain.  Patient states that she recently tested positive for gonorrhea, her and her boyfriend were both treated for this, but she only waited 5 days before having sexual intercourse.  She notes that her partner began developing discharge over the past couple days.  She denies any discharge, but does note to have some dryness and itching and she is concerned about yeast infection as she also previously tested positive for this and BV.  She is also concerned about her right ankle, she has had pain in this ankle for the past 4 to 5 days which has continued to worsen.  She notes that she fell, but cannot remember exactly how she twisted her ankle.  Has had worsening pain and, pain with weightbearing.  HPI  Past Medical History:  Diagnosis Date  . Asthma     There are no active problems to display for this patient.   Past Surgical History:  Procedure Laterality Date  . WISDOM TOOTH EXTRACTION      OB History   None      Home Medications    Prior to Admission medications   Medication Sig Start Date End Date Taking? Authorizing Provider  metroNIDAZOLE (FLAGYL) 500 MG tablet Take 1 tablet (500 mg total) by mouth 2 (two) times daily for 7 days. 08/24/17 08/31/17  Wieters, Junius Creamer, PA-C    Family History Family History  Problem Relation Age of Onset  . Hypertension Father     Social History Social History   Tobacco Use  . Smoking status: Never Smoker  . Smokeless tobacco: Never Used  Substance Use Topics  . Alcohol use: Yes    Comment: occasional  . Drug use: No     Allergies   Patient has no known allergies.   Review of Systems Review of Systems    Constitutional: Negative for fever.  Respiratory: Negative for shortness of breath.   Cardiovascular: Negative for chest pain.  Gastrointestinal: Negative for abdominal pain, diarrhea, nausea and vomiting.  Genitourinary: Negative for dysuria, flank pain, genital sores, hematuria, menstrual problem, vaginal bleeding, vaginal discharge and vaginal pain.  Musculoskeletal: Positive for arthralgias, gait problem and myalgias. Negative for back pain.  Skin: Negative for rash.  Neurological: Negative for dizziness, light-headedness and headaches.     Physical Exam Triage Vital Signs ED Triage Vitals  Enc Vitals Group     BP 08/24/17 1914 115/76     Pulse Rate 08/24/17 1914 79     Resp 08/24/17 1914 16     Temp 08/24/17 1914 98.2 F (36.8 C)     Temp Source 08/24/17 1914 Oral     SpO2 08/24/17 1914 97 %     Weight --      Height --      Head Circumference --      Peak Flow --      Pain Score 08/24/17 1915 5     Pain Loc --      Pain Edu? --      Excl. in GC? --    No data found.  Updated Vital Signs BP 115/76   Pulse 79  Temp 98.2 F (36.8 C) (Oral)   Resp 16   LMP 08/03/2017   SpO2 97%   Visual Acuity Right Eye Distance:   Left Eye Distance:   Bilateral Distance:    Right Eye Near:   Left Eye Near:    Bilateral Near:     Physical Exam  Constitutional: She is oriented to person, place, and time. She appears well-developed and well-nourished.  No acute distress  HENT:  Head: Normocephalic and atraumatic.  Nose: Nose normal.  Eyes: Conjunctivae are normal.  Neck: Neck supple.  Cardiovascular: Normal rate.  Pulmonary/Chest: Effort normal. No respiratory distress.  Abdominal: Soft. She exhibits no distension. There is no tenderness.  Nontender light deep palpation throughout all 4 quadrants and suprapubic area  Genitourinary:  Genitourinary Comments: Deferred-denies lesions and pain  Musculoskeletal: Normal range of motion.  Mild swelling to lateral malleolus  of right ankle, tenderness to palpation over lateral malleolus, full active range of motion, dorsalis pedis 2+, cap refill less than 2 seconds, gait with minimal abnormality  Neurological: She is alert and oriented to person, place, and time.  Skin: Skin is warm and dry.  Psychiatric: She has a normal mood and affect.  Nursing note and vitals reviewed.    UC Treatments / Results  Labs (all labs ordered are listed, but only abnormal results are displayed) Labs Reviewed  POCT URINALYSIS DIP (DEVICE)  POCT PREGNANCY, URINE  CERVICOVAGINAL ANCILLARY ONLY    EKG None  Radiology Dg Ankle Complete Right  Result Date: 08/24/2017 CLINICAL DATA:  Syncope, fell, pain EXAM: RIGHT ANKLE - COMPLETE 3+ VIEW COMPARISON:  None. FINDINGS: There is no evidence of fracture, dislocation, or joint effusion. There is no evidence of arthropathy or other focal bone abnormality. Soft tissues are unremarkable. IMPRESSION: Negative. Electronically Signed   By: Corlis Leak  Hassell M.D.   On: 08/24/2017 20:00    Procedures Procedures (including critical care time)  Medications Ordered in UC Medications  cefTRIAXone (ROCEPHIN) injection 250 mg (250 mg Intramuscular Given 08/24/17 1947)  azithromycin (ZITHROMAX) tablet 1,000 mg (1,000 mg Oral Given 08/24/17 1948)    Initial Impression / Assessment and Plan / UC Course  I have reviewed the triage vital signs and the nursing notes.  Pertinent labs & imaging results that were available during my care of the patient were reviewed by me and considered in my medical decision making (see chart for details).     We will repeat treatment for gonorrhea and chlamydia, vaginal swab obtained and will send off to reconfirm results.  Patient also requested treatment for yeast and BV, provided Diflucan and metronidazole.  X-ray negative for acute fracture or bony abnormality related to ankle, will treat this as a sprain with Ace wrap, NSAIDs ice and elevation.  Weight-bear as  tolerated.  Discussed strict return precautions. Patient verbalized understanding and is agreeable with plan.  Final Clinical Impressions(s) / UC Diagnoses   Final diagnoses:  STD exposure  Sprain of right ankle, unspecified ligament, initial encounter     Discharge Instructions     We have treated you today for gonorrhea and chlamydia, with rocephin and azithromycin. Please refrain from sexual intercourse for 7 days while medicines eliminating infection.   We are testing you for Gonorrhea, Chlamydia, Trichomonas, Yeast and Bacterial Vaginosis. We will call you if anything is positive and let you know if you require any further treatment. Please inform partners of any positive results.   Please return if symptoms not improving with treatment, development of  fever, nausea, vomiting, abdominal pain.   Use anti-inflammatories for pain/swelling. You may take up to 800 mg Ibuprofen every 8 hours with food. You may supplement Ibuprofen with Tylenol [phone] mg every 8 hours.   Ice and elevate ankle   ED Prescriptions    Medication Sig Dispense Auth. Provider   fluconazole (DIFLUCAN) 150 MG tablet Take 1 tablet (150 mg total) by mouth once for 1 dose. 2 tablet Wieters, Hallie C, PA-C   metroNIDAZOLE (FLAGYL) 500 MG tablet Take 1 tablet (500 mg total) by mouth 2 (two) times daily for 7 days. 14 tablet Wieters, Davis C, PA-C     Controlled Substance Prescriptions [place] Controlled Substance Registry consulted? Not Applicable   Lew Dawes, [place] 08/25/17 [phone]

## 2017-08-26 ENCOUNTER — Telehealth (HOSPITAL_COMMUNITY): Payer: Self-pay | Admitting: Emergency Medicine

## 2017-08-26 NOTE — Telephone Encounter (Signed)
Pt is concerned for recurrent BV infection.  She states she would like to try a different medication this time as opposed to what she took last month.  Pt has many questions related to her recurrent BV.  She is requesting medication changes and a call from a doctor to alleviate her concerns.  Thanks, Dayton MartesMunday, Jezebel Pollet A

## 2017-08-28 NOTE — Telephone Encounter (Signed)
Please call patient back.  She received the appropriate treatment for BV.  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

## 2017-08-29 ENCOUNTER — Encounter (HOSPITAL_COMMUNITY): Payer: Self-pay

## 2017-08-29 ENCOUNTER — Inpatient Hospital Stay (HOSPITAL_COMMUNITY)
Admission: AD | Admit: 2017-08-29 | Discharge: 2017-08-30 | Disposition: A | Discharge status: Home / Self Care | Payer: 59 | Source: Ambulatory Visit | Attending: Obstetrics and Gynecology | Admitting: Obstetrics and Gynecology

## 2017-08-29 ENCOUNTER — Telehealth (HOSPITAL_COMMUNITY): Payer: Self-pay

## 2017-08-29 ENCOUNTER — Other Ambulatory Visit: Payer: Self-pay

## 2017-08-29 DIAGNOSIS — Z8249 Family history of ischemic heart disease and other diseases of the circulatory system: Secondary | ICD-10-CM | POA: Diagnosis not present

## 2017-08-29 DIAGNOSIS — Z792 Long term (current) use of antibiotics: Secondary | ICD-10-CM | POA: Diagnosis not present

## 2017-08-29 DIAGNOSIS — R110 Nausea: Secondary | ICD-10-CM | POA: Insufficient documentation

## 2017-08-29 DIAGNOSIS — J45909 Unspecified asthma, uncomplicated: Secondary | ICD-10-CM | POA: Diagnosis not present

## 2017-08-29 DIAGNOSIS — Z3A01 Less than 8 weeks gestation of pregnancy: Secondary | ICD-10-CM

## 2017-08-29 DIAGNOSIS — R109 Unspecified abdominal pain: Secondary | ICD-10-CM | POA: Insufficient documentation

## 2017-08-29 DIAGNOSIS — N911 Secondary amenorrhea: Secondary | ICD-10-CM

## 2017-08-29 HISTORY — DX: Gonococcal infection, unspecified: A54.9

## 2017-08-29 HISTORY — DX: Chlamydial infection, unspecified: A74.9

## 2017-08-29 LAB — URINALYSIS, ROUTINE W REFLEX MICROSCOPIC
Bilirubin Urine: NEGATIVE
GLUCOSE, UA: NEGATIVE mg/dL
HGB URINE DIPSTICK: NEGATIVE
Ketones, ur: NEGATIVE mg/dL
LEUKOCYTES UA: NEGATIVE
Nitrite: NEGATIVE
PH: 6 (ref 5.0–8.0)
Protein, ur: NEGATIVE mg/dL
Specific Gravity, Urine: 1.023 (ref 1.005–1.030)

## 2017-08-29 LAB — POCT PREGNANCY, URINE: Preg Test, Ur: POSITIVE — AB

## 2017-08-29 NOTE — MAU Note (Signed)
Pt. States on 08/24/17 she was having bad cramps. Tried extra strength tylenol states it did not work.  Cramps in lower medial abd. Described as constant and rated 8/10. Pt. States it hurts worse during Intercourse and when walking or moving.  Pt. Has a dul headache at 3/10. Reports nausea and once occurrence of vomiting. Thought it was period cramps but then no bleeding or discharge.

## 2017-08-29 NOTE — Telephone Encounter (Signed)
attempted to reach patient regarding concerns for recurring BV. Per Dr. Delton SeeNelson the patient needs to see her PCP or OBGYN. No answer at this time. Voicemail left.

## 2017-08-29 NOTE — MAU Note (Addendum)
Abd cramping since 7/22 and thought was my period. Tried Tylenol and not helping. Pain makes me nauseated and head hurts. LMP 08/03/17. Denies vag d/c. Abd pain worse with walking and movement. Pt did upt and was negative. Currently taking Flagyl for BV per pt

## 2017-08-29 NOTE — MAU Provider Note (Signed)
History     Chief Complaint  Patient presents with  . Abdominal Pain  . Nausea   22 yo G0 SBF presents with c/o no cycle, abdominal pain and nausea. Pt was previously on DMPA but stopped due to feeling depressed on med. Pt has been using the pull out method. She did not want to use nexplanon or IUD. Pt notes abdominal cramping like cycle but no blood. Denies urinary sx. (+) nausea. BM ok. Pt was just seen in ED 7/22 where she had neg UPT and was treated for BV using Flagyl.  OB History    Gravida  1   Para      Term      Preterm      AB      Living        SAB      TAB      Ectopic      Multiple      Live Births              Past Medical History:  Diagnosis Date  . Asthma     Past Surgical History:  Procedure Laterality Date  . WISDOM TOOTH EXTRACTION      Family History  Problem Relation Age of Onset  . Hypertension Father     Social History   Tobacco Use  . Smoking status: Never Smoker  . Smokeless tobacco: Never Used  Substance Use Topics  . Alcohol use: Yes    Comment: occasional  . Drug use: No    Allergies: No Known Allergies  Medications Prior to Admission  Medication Sig Dispense Refill Last Dose  . metroNIDAZOLE (FLAGYL) 500 MG tablet Take 1 tablet (500 mg total) by mouth 2 (two) times daily for 7 days. 14 tablet 0      Physical Exam   Blood pressure 132/80, pulse (!) 104, temperature 98.1 F (36.7 C), resp. rate 18, height 5\' 2"  (1.575 m), weight 63 kg (139 lb), last menstrual period 08/03/2017.  General appearance: alert, cooperative and no distress Lungs: clear to auscultation bilaterally Heart: regular rate and rhythm, S1, S2 normal, no murmur, click, rub or gallop Abdomen: soft flat nontender Pelvic: cervix normal in appearance, external genitalia normal, no adnexal masses or tenderness, no cervical motion tenderness, uterus normal size, shape, and consistency, vagina normal without discharge and RV uterus Extremities: no  edema, redness or tenderness in the calves or thighs ED Course  IMP: 2nd amenorrhea (+) pregnancy test P)  Bhcg. Blood type MDM  Addendum:  hquant 29. D/c home. Call office on Monday for repeat hquant on Tuesday. No indication for radiological study given neg UPT 7/22 Serita KyleSheronette A Welcome Fults, MD 10:59 PM 08/29/2017

## 2017-08-30 ENCOUNTER — Inpatient Hospital Stay (HOSPITAL_COMMUNITY): Payer: 59

## 2017-08-30 ENCOUNTER — Inpatient Hospital Stay (HOSPITAL_COMMUNITY)
Admission: AD | Admit: 2017-08-30 | Discharge: 2017-08-30 | Disposition: A | Discharge status: Home / Self Care | Payer: 59 | Source: Ambulatory Visit | Attending: Obstetrics and Gynecology | Admitting: Obstetrics and Gynecology

## 2017-08-30 ENCOUNTER — Encounter (HOSPITAL_COMMUNITY): Payer: Self-pay

## 2017-08-30 ENCOUNTER — Other Ambulatory Visit: Payer: Self-pay

## 2017-08-30 DIAGNOSIS — Z3A01 Less than 8 weeks gestation of pregnancy: Secondary | ICD-10-CM | POA: Insufficient documentation

## 2017-08-30 DIAGNOSIS — R102 Pelvic and perineal pain: Principal | ICD-10-CM

## 2017-08-30 DIAGNOSIS — O26891 Other specified pregnancy related conditions, first trimester: Secondary | ICD-10-CM

## 2017-08-30 DIAGNOSIS — O26899 Other specified pregnancy related conditions, unspecified trimester: Secondary | ICD-10-CM

## 2017-08-30 DIAGNOSIS — R109 Unspecified abdominal pain: Secondary | ICD-10-CM

## 2017-08-30 LAB — HCG, QUANTITATIVE, PREGNANCY: hCG, Beta Chain, Quant, S: 129 m[IU]/mL — ABNORMAL HIGH (ref <5)

## 2017-08-30 LAB — ABO/RH: ABO/RH(D): O POS

## 2017-08-30 MED ORDER — CYCLOBENZAPRINE HCL 10 MG PO TABS: 10.0000 mg | ORAL_TABLET | Freq: Three times a day (TID) | ORAL | 0 refills | Status: DC | PRN

## 2017-08-30 MED ORDER — HYDROCODONE-ACETAMINOPHEN 5-325 MG PO TABS: 1.0000 | ORAL_TABLET | ORAL | 0 refills | Status: DC | PRN

## 2017-08-30 NOTE — MAU Provider Note (Signed)
History     Chief Complaint  Patient presents with  . Abdominal Pain  22 yo G1P0 presents again with c/o ongoing pelvic pain . Per pt pain radiates down left leg and makes it difficult to walk. She was seen less than 24 hr ago where she was found to have (+) SPT and low hquant. Her exam was unremarkable' Pt was also seen 7/22 in ED for unrelated sx and at that time she had neg UPT. She was given ceftriaxone IM and azithromycin. The genital cultures done 7/22 was neg g/c   OB History    Gravida  1   Para      Term      Preterm      AB      Living        SAB      TAB      Ectopic      Multiple      Live Births              Past Medical History:  Diagnosis Date  . Asthma   . Chlamydia   . Gonorrhea     Past Surgical History:  Procedure Laterality Date  . WISDOM TOOTH EXTRACTION      Family History  Problem Relation Age of Onset  . Hypertension Father     Social History   Tobacco Use  . Smoking status: Never Smoker  . Smokeless tobacco: Never Used  Substance Use Topics  . Alcohol use: Yes    Comment: occasional  . Drug use: No    Allergies: No Known Allergies  Medications Prior to Admission  Medication Sig Dispense Refill Last Dose  . metroNIDAZOLE (FLAGYL) 500 MG tablet Take 1 tablet (500 mg total) by mouth 2 (two) times daily for 7 days. 14 tablet 0 08/29/2017 at Unknown time     Physical Exam   Blood pressure (!) 114/56, pulse 85, temperature 98.1 F (36.7 C), temperature source Oral, resp. rate 18, weight 62.1 kg (137 lb), unknown if currently breastfeeding.  No exam performed today, last night. ED Course  Pelvic pain in pregnancy P) TV U/S. ucx sent MDM   Serita KyleSheronette A Labradford Schnitker, MD 4:18 PM 08/30/2017  Addendum' [place] Ob Transvaginal  Result Date: 08/30/2017 CLINICAL DATA:  22 year old female with positive pregnancy test and pelvic pain. Estimated gestational age of [redacted] weeks 6 days by LMP. EXAM: TRANSVAGINAL OB ULTRASOUND TECHNIQUE:  Transvaginal ultrasound was performed for complete evaluation of the gestation as well as the maternal uterus, adnexal regions, and pelvic cul-de-sac. COMPARISON:  None. FINDINGS: Intrauterine gestational sac: None Yolk sac:  Not Visualized. Embryo:  Not Visualized. Maternal uterus/adnexae: The ovaries bilaterally are unremarkable. Homogeneous endometrial thickening is noted measuring at least 2 cm. A small amount of free pelvic fluid is noted. There is no evidence of adnexal mass. IMPRESSION: 1. No intrauterine gestational sac, yolk sac, fetal pole, or cardiac activity visualized. Differential considerations include intrauterine gestation too early to be sonographically visualized, spontaneous abortion, or ectopic pregnancy. Consider follow-up ultrasound in 10 days and serial quantitative beta HCG follow-up. 2. Small amount of nonspecific free pelvic fluid.  No adnexal mass. Electronically Signed   By: Harmon PierJeffrey  Hu M.D.   On: 08/30/2017 14:50   Reviewed sonogram with pt and her partner. As previously discussed need to make appt for repeat Hquant.  Script for vicodin( #10) given. Flexeril sent in . Instructed to use flexeril first in the event musculoskeletal as pain goes down left  leg. Agree with plan

## 2017-08-30 NOTE — Discharge Instructions (Signed)

## 2017-08-30 NOTE — MAU Note (Signed)
Feels like she is contracting  Same as yesterday  Tylenol is not helping  No bleeding

## 2017-08-31 LAB — URINE CULTURE
CULTURE: NO GROWTH
SPECIAL REQUESTS: NORMAL

## 2017-09-03 ENCOUNTER — Encounter (HOSPITAL_COMMUNITY): Payer: Self-pay | Admitting: *Deleted

## 2017-09-03 ENCOUNTER — Inpatient Hospital Stay (HOSPITAL_COMMUNITY): Payer: 59

## 2017-09-03 ENCOUNTER — Inpatient Hospital Stay (HOSPITAL_COMMUNITY)
Admission: AD | Admit: 2017-09-03 | Discharge: 2017-09-03 | Disposition: A | Discharge status: Home / Self Care | Payer: 59 | Source: Ambulatory Visit | Attending: Obstetrics and Gynecology | Admitting: Obstetrics and Gynecology

## 2017-09-03 DIAGNOSIS — O26891 Other specified pregnancy related conditions, first trimester: Secondary | ICD-10-CM | POA: Insufficient documentation

## 2017-09-03 DIAGNOSIS — R109 Unspecified abdominal pain: Secondary | ICD-10-CM

## 2017-09-03 DIAGNOSIS — R1030 Lower abdominal pain, unspecified: Secondary | ICD-10-CM | POA: Diagnosis present

## 2017-09-03 DIAGNOSIS — Z3A01 Less than 8 weeks gestation of pregnancy: Secondary | ICD-10-CM | POA: Diagnosis not present

## 2017-09-03 DIAGNOSIS — O3680X0 Pregnancy with inconclusive fetal viability, not applicable or unspecified: Secondary | ICD-10-CM | POA: Diagnosis not present

## 2017-09-03 LAB — CBC WITH DIFFERENTIAL/PLATELET
BASOS PCT: 0 %
Basophils Absolute: 0 10*3/uL (ref 0.0–0.1)
EOS ABS: 0.1 10*3/uL (ref 0.0–0.7)
EOS PCT: 1 %
HCT: 39.6 % (ref 36.0–46.0)
HEMOGLOBIN: 13.4 g/dL (ref 12.0–15.0)
LYMPHS ABS: 4.7 10*3/uL — AB (ref 0.7–4.0)
Lymphocytes Relative: 35 %
MCH: 29.1 pg (ref 26.0–34.0)
MCHC: 33.8 g/dL (ref 30.0–36.0)
MCV: 85.9 fL (ref 78.0–100.0)
MONOS PCT: 3 %
Monocytes Absolute: 0.4 10*3/uL (ref 0.1–1.0)
NEUTROS PCT: 61 %
Neutro Abs: 8.4 10*3/uL — ABNORMAL HIGH (ref 1.7–7.7)
PLATELETS: 273 10*3/uL (ref 150–400)
RBC: 4.61 MIL/uL (ref 3.87–5.11)
RDW: 14.8 % (ref 11.5–15.5)
WBC: 13.6 10*3/uL — ABNORMAL HIGH (ref 4.0–10.5)

## 2017-09-03 LAB — URINALYSIS, ROUTINE W REFLEX MICROSCOPIC
BILIRUBIN URINE: NEGATIVE
Glucose, UA: NEGATIVE mg/dL
HGB URINE DIPSTICK: NEGATIVE
KETONES UR: NEGATIVE mg/dL
Leukocytes, UA: NEGATIVE
Nitrite: NEGATIVE
Protein, ur: NEGATIVE mg/dL
Specific Gravity, Urine: 1.028 (ref 1.005–1.030)
pH: 5 (ref 5.0–8.0)

## 2017-09-03 MED ORDER — OXYCODONE-ACETAMINOPHEN 5-325 MG PO TABS
1.0000 | ORAL_TABLET | Freq: Once | ORAL | Status: AC
  Administered 2017-09-03: 1 via ORAL
  Filled 2017-09-03: qty 1

## 2017-09-03 MED ORDER — OXYCODONE-ACETAMINOPHEN 5-325 MG PO TABS: 1.0000 | ORAL_TABLET | Freq: Four times a day (QID) | ORAL | 0 refills | Status: DC | PRN

## 2017-09-03 NOTE — MAU Provider Note (Signed)
Chief Complaint: Abdominal Pain   First Provider Initiated Contact with Patient 09/03/17 2049        SUBJECTIVE HPI: Sandra Morris is a 22 y.o. G1P0 at [redacted]w[redacted]d by LMP who presents to maternity admissions reporting lower abdominal pain.  This is her third visit for this. States she is tired of having pain.  Wants to "just get rid of it, it is not worth it anymore".  Refuses pelvic exam.  Is worried that "something else is wrong, like endometriosis, fibroids or something worse.". States we "keep telling me it's normal but I know it is not". She denies vaginal bleeding, vaginal itching/burning, urinary symptoms, h/a, dizziness, n/v, or fever/chills.    Has been seen by Dr Cherly Hensen before for the same pregnancy.  HCG levels have been doubling appropriately.  Last [place] did not show anything in uterus.  Abdominal Pain  This is a recurrent problem. The problem occurs intermittently. The problem has been unchanged. The pain is located in the suprapubic region, LLQ and RLQ. The quality of the pain is cramping. The abdominal pain does not radiate. Pertinent negatives include no constipation, diarrhea, dysuria, fever, frequency, myalgias, nausea or vomiting. Nothing aggravates the pain. The pain is relieved by nothing. She has tried oral narcotic analgesics for the symptoms. The treatment provided mild relief.   RN Note: Pt reports she has had abd pain has been seen twice for this already. Pt is pregnant. U/S a few weeks ago unable to see baby. HCG 1173 today. Sent to r/o ectopic.    Past Medical History:  Diagnosis Date  . Asthma   . Chlamydia   . Gonorrhea    Past Surgical History:  Procedure Laterality Date  . WISDOM TOOTH EXTRACTION     Social History   Socioeconomic History  . Marital status: Single    Spouse name: Not on file  . Number of children: Not on file  . Years of education: Not on file  . Highest education level: Not on file  Occupational History  . Not on file  Social Needs   . Financial resource strain: Not on file  . Food insecurity:    Worry: Not on file    Inability: Not on file  . Transportation needs:    Medical: Not on file    Non-medical: Not on file  Tobacco Use  . Smoking status: Never Smoker  . Smokeless tobacco: Never Used  Substance and Sexual Activity  . Alcohol use: Yes    Comment: occasional  . Drug use: No  . Sexual activity: Yes    Birth control/protection: None  Lifestyle  . Physical activity:    Days per week: Not on file    Minutes per session: Not on file  . Stress: Not on file  Relationships  . Social connections:    Talks on phone: Not on file    Gets together: Not on file    Attends religious service: Not on file    Active member of club or organization: Not on file    Attends meetings of clubs or organizations: Not on file    Relationship status: Not on file  . Intimate partner violence:    Fear of current or ex partner: Not on file    Emotionally abused: Not on file    Physically abused: Not on file    Forced sexual activity: Not on file  Other Topics Concern  . Not on file  Social History Narrative  . Not on  file   No current facility-administered medications on file prior to encounter.    Current Outpatient Medications on File Prior to Encounter  Medication Sig Dispense Refill  . cyclobenzaprine (FLEXERIL) 10 MG tablet Take 1 tablet (10 mg total) by mouth 3 (three) times daily as needed for muscle spasms. 30 tablet 0  . HYDROcodone-acetaminophen (NORCO/VICODIN) 5-325 MG tablet Take 1 tablet by mouth every 4 (four) hours as needed. 10 tablet 0   No Known Allergies  I have reviewed patient's Past Medical Hx, Surgical Hx, Family Hx, Social Hx, medications and allergies.   ROS:  Review of Systems  Constitutional: Negative for fever.  Gastrointestinal: Positive for abdominal pain. Negative for constipation, diarrhea, nausea and vomiting.  Genitourinary: Negative for dysuria and frequency.  Musculoskeletal:  Negative for myalgias.   Review of Systems  Other systems negative   Physical Exam  Physical Exam Patient Vitals for the past 24 hrs:  BP Temp Pulse Resp  09/03/17 1859 120/61 98.5 F (36.9 C) 87 18   Constitutional: Well-developed, well-nourished female in no acute distress.  Cardiovascular: normal rate Respiratory: normal effort GI: Abd soft, non-tender. Pos BS x 4  No guarding or rebound. MS: Extremities nontender, no edema, normal ROM Neurologic: Alert and oriented x 4.  GU: Neg CVAT.  PELVIC EXAM: Declined  LAB RESULTS Results for orders placed or performed during the hospital encounter of 09/03/17 (from the past 24 hour(s))  Urinalysis, Routine w reflex microscopic     Status: None   Collection Time: 09/03/17  7:08 PM  Result Value Ref Range   Color, Urine YELLOW YELLOW   APPearance CLEAR CLEAR   Specific Gravity, Urine 1.028 1.005 - 1.030   pH 5.0 5.0 - 8.0   Glucose, UA NEGATIVE NEGATIVE mg/dL   Hgb urine dipstick NEGATIVE NEGATIVE   Bilirubin Urine NEGATIVE NEGATIVE   Ketones, ur NEGATIVE NEGATIVE mg/dL   Protein, ur NEGATIVE NEGATIVE mg/dL   Nitrite NEGATIVE NEGATIVE   Leukocytes, UA NEGATIVE NEGATIVE  CBC with Differential/Platelet     Status: Abnormal   Collection Time: 09/03/17  9:47 PM  Result Value Ref Range   WBC 13.6 (H) 4.0 - 10.5 K/uL   RBC 4.61 3.87 - 5.11 MIL/uL   Hemoglobin 13.4 12.0 - 15.0 g/dL   HCT 16.1 09.6 - 04.5 %   MCV 85.9 78.0 - 100.0 fL   MCH 29.1 26.0 - 34.0 pg   MCHC 33.8 30.0 - 36.0 g/dL   RDW 40.9 81.1 - 91.4 %   Platelets 273 150 - 400 K/uL   Neutrophils Relative % 61 %   Neutro Abs 8.4 (H) 1.7 - 7.7 K/uL   Lymphocytes Relative 35 %   Lymphs Abs 4.7 (H) 0.7 - 4.0 K/uL   Monocytes Relative 3 %   Monocytes Absolute 0.4 0.1 - 1.0 K/uL   Eosinophils Relative 1 %   Eosinophils Absolute 0.1 0.0 - 0.7 K/uL   Basophils Relative 0 %   Basophils Absolute 0.0 0.0 - 0.1 K/uL    --/--/O POS Performed at [facility], [address], [place], [place] [address]  (07/27 2323)  IMAGING US Ob Transvaginal  Result Date: 09/03/2017 CLINICAL DATA:  Positive pregnancy test. EXAM: OBSTETRIC <14 WK [place] AND TRANSVAGINAL OB US TECHNIQUE: Both transabdominal and transvaginal ultrasound examinations were performed for complete evaluation of the gestation as well as the maternal uterus, adnexal regions, and pelvic cul-de-sac. Transvaginal technique was performed to assess early pregnancy. COMPARISON:  08/30/2017 FINDINGS: Intrauterine gestational  sac: Tiny cystic focus projects in the decidualized endometrium suggesting gestational sac. No yolk sac or embryo yet evident. Yolk sac:  Not visualized. Embryo:  Not visualized. MSD: 3.3 mm   5 w   0 d Subchorionic hemorrhage:  None visualized. Maternal uterus/adnexae: Unremarkable.  No adnexal mass. IMPRESSION: 1. Tiny probable intrauterine gestational sac although no yolk sac or embryo yet evident. Close continued follow-up recommended to exclude nonvisualized ectopic gestation. Electronically Signed   By: Kennith CenterEric  Mansell M.D.   On: 09/03/2017 20:06   MAU Management/MDM: Consulted Dr Amado NashAlmquist with presentation, exam findings and results CBC done to help rule out acute abdominal infection. Single Percocet given which provided complete relief of pain.  Encouraged patient to call Dr Cherly Hensenousins in am or on Monday to discuss her concerns Reviewed findings and reassurances that we are working through possible sources of pelvic pain  ASSESSMENT 1. Pregnancy with inconclusive fetal viability, single or unspecified fetus   2. Abdominal pain during pregnancy in first trimester     PLAN Discharge home Will repeat  Ultrasound in about 7-10 days if HCG levels double appropriately  Ectopic precautions   Pt stable at time of discharge. Encouraged to return here or to other Urgent Care/ED if she develops worsening of symptoms, increase in pain, fever, or other concerning symptoms.    Wynelle BourgeoisMarie  Williams CNM, MSN Certified Nurse-Midwife 09/03/2017  8:49 PM

## 2017-09-03 NOTE — MAU Note (Signed)
Pt reports she has had abd pain has been seen twice for this already. Pt is pregnant. U/S a few weeks ago unable to see baby. HCG 1173 today. Sent to r/o ectopic.

## 2017-09-03 NOTE — Discharge Instructions (Signed)
Abdominal Pain During Pregnancy °Belly (abdominal) pain is common during pregnancy. Most of the time, it is not a serious problem. Other times, it can be a sign that something is wrong with the pregnancy. Always tell your doctor if you have belly pain. °Follow these instructions at home: °Monitor your belly pain for any changes. The following actions may help you feel better: °· Do not have sex (intercourse) or put anything in your vagina until you feel better. °· Rest until your pain stops. °· Drink clear fluids if you feel sick to your stomach (nauseous). Do not eat solid food until you feel better. °· Only take medicine as told by your doctor. °· Keep all doctor visits as told. ° °Get help right away if: °· You are bleeding, leaking fluid, or pieces of tissue come out of your vagina. °· You have more pain or cramping. °· You keep throwing up (vomiting). °· You have pain when you pee (urinate) or have blood in your pee. °· You have a fever. °· You do not feel your baby moving as much. °· You feel very weak or feel like passing out. °· You have trouble breathing, with or without belly pain. °· You have a very bad headache and belly pain. °· You have fluid leaking from your vagina and belly pain. °· You keep having watery poop (diarrhea). °· Your belly pain does not go away after resting, or the pain gets worse. °This information is not intended to replace advice given to you by your health care provider. Make sure you discuss any questions you have with your health care provider. °Document Released: 01/08/2009 Document Revised: 08/29/2015 Document Reviewed: 08/19/2012 °Elsevier Interactive Patient Education © 2018 Elsevier Inc. ° °First Trimester of Pregnancy °The first trimester of pregnancy is from week 1 until the end of week 13 (months 1 through 3). During this time, your baby will begin to develop inside you. At 6-8 weeks, the eyes and face are formed, and the heartbeat can be seen on ultrasound. At the end of  12 weeks, all the baby's organs are formed. Prenatal care is all the medical care you receive before the birth of your baby. Make sure you get good prenatal care and follow all of your doctor's instructions. °Follow these instructions at home: °Medicines °· Take over-the-counter and prescription medicines only as told by your doctor. Some medicines are safe and some medicines are not safe during pregnancy. °· Take a prenatal vitamin that contains at least 600 micrograms (mcg) of folic acid. °· If you have trouble pooping (constipation), take medicine that will make your stool soft (stool softener) if your doctor approves. °Eating and drinking °· Eat regular, healthy meals. °· Your doctor will tell you the amount of weight gain that is right for you. °· Avoid raw meat and uncooked cheese. °· If you feel sick to your stomach (nauseous) or throw up (vomit): °? Eat 4 or 5 small meals a day instead of 3 large meals. °? Try eating a few soda crackers. °? Drink liquids between meals instead of during meals. °· To prevent constipation: °? Eat foods that are high in fiber, like fresh fruits and vegetables, whole grains, and beans. °? Drink enough fluids to keep your pee (urine) clear or pale yellow. °Activity °· Exercise only as told by your doctor. Stop exercising if you have cramps or pain in your lower belly (abdomen) or low back. °· Do not exercise if it is too hot, too humid, or   if you are in a place of great height (high altitude). °· Try to avoid standing for long periods of time. Move your legs often if you must stand in one place for a long time. °· Avoid heavy lifting. °· Wear low-heeled shoes. Sit and stand up straight. °· You can have sex unless your doctor tells you not to. °Relieving pain and discomfort °· Wear a good support bra if your breasts are sore. °· Take warm water baths (sitz baths) to soothe pain or discomfort caused by hemorrhoids. Use hemorrhoid cream if your doctor says it is okay. °· Rest with  your legs raised if you have leg cramps or low back pain. °· If you have puffy, bulging veins (varicose veins) in your legs: °? Wear support hose or compression stockings as told by your doctor. °? Raise (elevate) your feet for 15 minutes, 3-4 times a day. °? Limit salt in your food. °Prenatal care °· Schedule your prenatal visits by the twelfth week of pregnancy. °· Write down your questions. Take them to your prenatal visits. °· Keep all your prenatal visits as told by your doctor. This is important. °Safety °· Wear your seat belt at all times when driving. °· Make a list of emergency phone numbers. The list should include numbers for family, friends, the hospital, and police and fire departments. °General instructions °· Ask your doctor for a referral to a local prenatal class. Begin classes no later than at the start of month 6 of your pregnancy. °· Ask for help if you need counseling or if you need help with nutrition. Your doctor can give you advice or tell you where to go for help. °· Do not use hot tubs, steam rooms, or saunas. °· Do not douche or use tampons or scented sanitary pads. °· Do not cross your legs for long periods of time. °· Avoid all herbs and alcohol. Avoid drugs that are not approved by your doctor. °· Do not use any tobacco products, including cigarettes, chewing tobacco, and electronic cigarettes. If you need help quitting, ask your doctor. You may get counseling or other support to help you quit. °· Avoid cat litter boxes and soil used by cats. These carry germs that can cause birth defects in the baby and can cause a loss of your baby (miscarriage) or stillbirth. °· Visit your dentist. At home, brush your teeth with a soft toothbrush. Be gentle when you floss. °Contact a doctor if: °· You are dizzy. °· You have mild cramps or pressure in your lower belly. °· You have a nagging pain in your belly area. °· You continue to feel sick to your stomach, you throw up, or you have watery poop  (diarrhea). °· You have a bad smelling fluid coming from your vagina. °· You have pain when you pee (urinate). °· You have increased puffiness (swelling) in your face, hands, legs, or ankles. °Get help right away if: °· You have a fever. °· You are leaking fluid from your vagina. °· You have spotting or bleeding from your vagina. °· You have very bad belly cramping or pain. °· You gain or lose weight rapidly. °· You throw up blood. It may look like coffee grounds. °· You are around people who have German measles, fifth disease, or chickenpox. °· You have a very bad headache. °· You have shortness of breath. °· You have any kind of trauma, such as from a fall or a car accident. °Summary °· The first trimester of   pregnancy is from week 1 until the end of week 13 (months 1 through 3). °· To take care of yourself and your unborn baby, you will need to eat healthy meals, take medicines only if your doctor tells you to do so, and do activities that are safe for you and your baby. °· Keep all follow-up visits as told by your doctor. This is important as your doctor will have to ensure that your baby is healthy and growing well. °This information is not intended to replace advice given to you by your health care provider. Make sure you discuss any questions you have with your health care provider. °Document Released: 07/09/2007 Document Revised: 01/29/2016 Document Reviewed: 01/29/2016 °Elsevier Interactive Patient Education © 2017 Elsevier Inc. ° °

## 2017-09-22 NOTE — Progress Notes (Signed)
Cardiology Office Note   Date:  09/23/2017   ID:  Sandra Feilaylor S Mckamie, DOB [birth year], MRN [id number]  PCP:  Tally JoeSwayne, David, MD  Cardiologist:   No primary care provider on file. Referring:  Tally JoeSwayne, David, MD   Chief Complaint  Patient presents with  . Loss of Consciousness     History of Present Illness: Sandra Morris is a 22 y.o. female who is currently G1P0A0, [redacted] weeks gestations, referred by Tally JoeSwayne, David, MD for evaluation of syncope. Reports she has had about 5 syncopal episodes over the past 3 years, the last episode was last month. States that every episode is associated with either walking or getting up from a seated position, heart palpitations, and chest tightness. Does not know how long she is unconscious for. No seizure-like activities. No family history of sudden cardiac deaths.   [facility] 2017 for syncope after being outside at the pool all day. She was at [employer] where she felt dizzy and got up to purchase water where she synopsized and hit her head against a chair with a resultant small eyebrow laceration. EMS was called and en route to the ED reported BP of 60/38 with improvement with bolus of fluid.   In July 2019, she as evaluated at her primary care provider for another syncopal episode. She reports getting up from a seated position in a car and then synopsizing, hurting her right ankle. She did have a negative orthostatic BP workup at her primary office as well as basic labs and TSH negative.      Past Medical History:  Diagnosis Date  . Asthma   . Chlamydia   . Gonorrhea     Past Surgical History:  Procedure Laterality Date  . WISDOM TOOTH EXTRACTION       No current outpatient medications on file.   No current facility-administered medications for this visit.     Allergies:   Patient has no known allergies.    Social History:  The patient  reports that she has been smoking. She has a 0.75 pack-year smoking  history. She has never used smokeless tobacco. She reports that she drinks alcohol. She reports that she does not use drugs.   Family History:  The patient's family history includes Hypertension in her father.    ROS:  Please see the history of present illness.   Otherwise, review of systems are positive for none.   All other systems are reviewed and negative.    PHYSICAL EXAM: VS:  BP 113/67   Pulse 79   Ht 5\' 3"  (1.6 m)   Wt 140 lb 9.6 oz (63.8 kg)   LMP 08/06/2017 (Approximate)   BMI 24.91 kg/m  , BMI Body mass index is 24.91 kg/m. GENERAL:  Well appearing NECK:  No jugular venous distention, waveform within normal limits, carotid upstroke brisk and symmetric, no bruits, no thyromegaly LYMPHATICS:  No cervical, inguinal adenopathy LUNGS:  Clear to auscultation bilaterally CHEST:  Unremarkable HEART:  PMI not displaced or sustained,S1 and S2 within normal limits, no S3, no S4, no clicks, no rubs, no murmurs ABD:  Flat, positive bowel sounds normal in frequency in pitch, no bruits, no rebound, no guarding, no midline pulsatile mass, no hepatomegaly, no splenomegaly EXT:  2 plus pulses throughout, no edema, no cyanosis no clubbing   EKG:  EKG is ordered today. The ekg ordered today demonstrates normal sinus rhythm, rate 79. Normal axis, intervals. No QT  prolongation. No ST or T wave abnormalities.    Recent Labs: 09/03/2017: Hemoglobin 13.4; Platelets 273    Lipid Panel No results found for: CHOL, TRIG, HDL, CHOLHDL, VLDL, LDLCALC, LDLDIRECT    Wt Readings from Last 3 Encounters:  09/23/17 140 lb 9.6 oz (63.8 kg)  08/30/17 137 lb (62.1 kg)  08/29/17 139 lb (63 kg)      Other studies Reviewed: Additional studies/ records that were reviewed today include: primary care office notes. Review of the above records demonstrates: no orthostatic BP.  Please see elsewhere in the note.     ASSESSMENT AND PLAN:  SYNCOPE: Here does not lead to any major arrhythmia episodes,  though will further evaluate with an echocardiogram as well as Ziopatch 14 day monitor. She does low-normal blood pressure and advised her to push fluids as well as increase salt intake. Also advised to lay down if she feels like synopsizing.     Current medicines are reviewed at length with the patient today.  The patient does not have concerns regarding medicines.  The following changes have been made:  no change  Labs/ tests ordered today include: EKG, echocardiogram, Ziopatch.   Orders Placed This Encounter  Procedures  . LONG TERM MONITOR (3-14 DAYS)  . EKG 12-Lead  . ECHOCARDIOGRAM COMPLETE     Disposition:   FU with me prn.     Signed, Rollene RotundaJames Alysha Doolan, MD  09/23/2017 6:05 PM    [facility]

## 2017-09-23 ENCOUNTER — Ambulatory Visit (INDEPENDENT_AMBULATORY_CARE_PROVIDER_SITE_OTHER): Payer: 59 | Admitting: Cardiology

## 2017-09-23 ENCOUNTER — Encounter: Payer: Self-pay | Admitting: Cardiology

## 2017-09-23 VITALS — BP 113/67 | HR 79 | Ht 63.0 in | Wt 140.6 lb

## 2017-09-23 DIAGNOSIS — R55 Syncope and collapse: Secondary | ICD-10-CM | POA: Diagnosis not present

## 2017-09-23 NOTE — Patient Instructions (Signed)
Medication Instructions:  Continue current medications  If you need a refill on your cardiac medications before your next appointment, please call your pharmacy.  Labwork: None Ordered   Testing/Procedures: Your physician has recommended that you wear an event monitor 14 days. Event monitors are medical devices that record the heart's electrical activity. Doctors most often us these monitors to diagnose arrhythmias. Arrhythmias are problems with the speed or rhythm of the heartbeat. The monitor is a small, portable device. You can wear one while you do your normal daily activities. This is usually used to diagnose what is causing palpitations/syncope (passing out).  Your physician has requested that you have an echocardiogram. Echocardiography is a painless test that uses sound waves to create images of your heart. It provides your doctor with information about the size and shape of your heart and how well your heart's chambers and valves are working. This procedure takes approximately one hour. There are no restrictions for this procedure.   Follow-Up: Your physician wants you to follow-up in: As Needed.      Thank you for choosing [facility] at [facility]!!

## 2017-09-30 ENCOUNTER — Ambulatory Visit (INDEPENDENT_AMBULATORY_CARE_PROVIDER_SITE_OTHER): Payer: 59

## 2017-09-30 ENCOUNTER — Other Ambulatory Visit: Payer: Self-pay

## 2017-09-30 ENCOUNTER — Ambulatory Visit (HOSPITAL_COMMUNITY): Payer: 59 | Attending: Cardiology

## 2017-09-30 DIAGNOSIS — R55 Syncope and collapse: Secondary | ICD-10-CM | POA: Diagnosis present

## 2017-10-01 ENCOUNTER — Telehealth: Payer: Self-pay | Admitting: Cardiology

## 2017-10-01 NOTE — Telephone Encounter (Signed)
Notes recorded by Rollene RotundaHochrein, James, MD on 09/30/2017 at 8:05 PM EDT Echo is normal.   Patient aware of results

## 2017-10-01 NOTE — Telephone Encounter (Signed)
New Message   Pt returning call for nurse about echo results. Pt states she is at work and request to be called between 1-4

## 2017-12-29 ENCOUNTER — Emergency Department (HOSPITAL_BASED_OUTPATIENT_CLINIC_OR_DEPARTMENT_OTHER)
Admission: EM | Admit: 2017-12-29 | Discharge: 2017-12-30 | Disposition: A | Discharge status: Home / Self Care | Payer: 59 | Attending: Emergency Medicine | Admitting: Emergency Medicine

## 2017-12-29 ENCOUNTER — Emergency Department (HOSPITAL_BASED_OUTPATIENT_CLINIC_OR_DEPARTMENT_OTHER): Payer: 59

## 2017-12-29 ENCOUNTER — Other Ambulatory Visit: Payer: Self-pay

## 2017-12-29 ENCOUNTER — Encounter (HOSPITAL_BASED_OUTPATIENT_CLINIC_OR_DEPARTMENT_OTHER): Payer: Self-pay | Admitting: Emergency Medicine

## 2017-12-29 DIAGNOSIS — K5900 Constipation, unspecified: Secondary | ICD-10-CM

## 2017-12-29 DIAGNOSIS — R109 Unspecified abdominal pain: Secondary | ICD-10-CM | POA: Insufficient documentation

## 2017-12-29 DIAGNOSIS — R160 Hepatomegaly, not elsewhere classified: Secondary | ICD-10-CM | POA: Diagnosis not present

## 2017-12-29 DIAGNOSIS — Z87891 Personal history of nicotine dependence: Secondary | ICD-10-CM | POA: Insufficient documentation

## 2017-12-29 DIAGNOSIS — R1011 Right upper quadrant pain: Secondary | ICD-10-CM | POA: Diagnosis not present

## 2017-12-29 DIAGNOSIS — J45909 Unspecified asthma, uncomplicated: Secondary | ICD-10-CM | POA: Insufficient documentation

## 2017-12-29 DIAGNOSIS — K769 Liver disease, unspecified: Secondary | ICD-10-CM

## 2017-12-29 LAB — COMPREHENSIVE METABOLIC PANEL
ALBUMIN: 4.5 g/dL (ref 3.5–5.0)
ALT: 22 U/L (ref 0–44)
AST: 20 U/L (ref 15–41)
Alkaline Phosphatase: 88 U/L (ref 38–126)
Anion gap: 7 (ref 5–15)
BUN: 12 mg/dL (ref 6–20)
CALCIUM: 9.7 mg/dL (ref 8.9–10.3)
CHLORIDE: 105 mmol/L (ref 98–111)
CO2: 26 mmol/L (ref 22–32)
CREATININE: 0.76 mg/dL (ref 0.44–1.00)
GFR calc Af Amer: >60 mL/min (ref >60)
GFR calc non Af Amer: >60 mL/min (ref >60)
Glucose, Bld: 87 mg/dL (ref 70–99)
POTASSIUM: 3.6 mmol/L (ref 3.5–5.1)
Sodium: 138 mmol/L (ref 135–145)
Total Bilirubin: 0.7 mg/dL (ref 0.3–1.2)
Total Protein: 8 g/dL (ref 6.5–8.1)

## 2017-12-29 LAB — CBC WITH DIFFERENTIAL/PLATELET
ABS IMMATURE GRANULOCYTES: 0.03 10*3/uL (ref 0.00–0.07)
Basophils Absolute: 0 10*3/uL (ref 0.0–0.1)
Basophils Relative: 0 %
EOS PCT: 1 %
Eosinophils Absolute: 0.1 10*3/uL (ref 0.0–0.5)
HCT: 45 % (ref 36.0–46.0)
HEMOGLOBIN: 14.4 g/dL (ref 12.0–15.0)
Immature Granulocytes: 0 %
LYMPHS ABS: 3.1 10*3/uL (ref 0.7–4.0)
LYMPHS PCT: 38 %
MCH: 28.2 pg (ref 26.0–34.0)
MCHC: 32 g/dL (ref 30.0–36.0)
MCV: 88.1 fL (ref 80.0–100.0)
MONO ABS: 0.5 10*3/uL (ref 0.1–1.0)
Monocytes Relative: 6 %
Neutro Abs: 4.5 10*3/uL (ref 1.7–7.7)
Neutrophils Relative %: 55 %
PLATELETS: 330 10*3/uL (ref 150–400)
RBC: 5.11 MIL/uL (ref 3.87–5.11)
RDW: 13.5 % (ref 11.5–15.5)
WBC: 8.2 10*3/uL (ref 4.0–10.5)
nRBC: 0 % (ref 0.0–0.2)

## 2017-12-29 LAB — URINALYSIS, ROUTINE W REFLEX MICROSCOPIC
Bilirubin Urine: NEGATIVE
GLUCOSE, UA: NEGATIVE mg/dL
HGB URINE DIPSTICK: NEGATIVE
Ketones, ur: NEGATIVE mg/dL
LEUKOCYTES UA: NEGATIVE
Nitrite: NEGATIVE
PROTEIN: NEGATIVE mg/dL
SPECIFIC GRAVITY, URINE: 1.015 (ref 1.005–1.030)
pH: 7.5 (ref 5.0–8.0)

## 2017-12-29 LAB — PREGNANCY, URINE: PREG TEST UR: NEGATIVE

## 2017-12-29 LAB — WET PREP, GENITAL
Sperm: NONE SEEN
Trich, Wet Prep: NONE SEEN
Yeast Wet Prep HPF POC: NONE SEEN

## 2017-12-29 MED ORDER — SODIUM CHLORIDE 0.9 % IV BOLUS
1000.0000 mL | Freq: Once | INTRAVENOUS | Status: AC
  Administered 2017-12-29: 1000 mL via INTRAVENOUS

## 2017-12-29 MED ORDER — IOPAMIDOL (ISOVUE-300) INJECTION 61%
100.0000 mL | Freq: Once | INTRAVENOUS | Status: AC | PRN
  Administered 2017-12-30: 100 mL via INTRAVENOUS

## 2017-12-29 NOTE — ED Triage Notes (Signed)
R flank pain x 2 days with urinary frequency.

## 2017-12-29 NOTE — ED Provider Notes (Signed)
[facility] EMERGENCY DEPARTMENT Provider Note   CSN: [id number] Arrival date & time: 12/29/17  1850     History   Chief Complaint Chief Complaint  Patient presents with  . Flank Pain    HPI Sandra Morris is a 22 y.o. female.  HPI  Patient is a 22 year old female with a history of asthma presenting for right flank and right-sided abdominal pain.  She reports that her symptoms began 1 day ago.  Patient reports that she presented to an urgent care yesterday and she had a clean urinalysis and the attempted to perform CBC, however were unable to obtain the blood.  She reports she has persisted in having right flank pain with intermittent sharpness since this time.  She reports it is mostly low in the flank.  She denies any cough, shortness of breath, or chest pain.  She reports that it is exquisitely tender to palpation.  No fever or chills.  She denies any nausea, or vomiting.  Patient denies any dysuria, urgency, frequency, vaginal discharge, or vaginal bleeding.  Patient reports normal bowel movements with no diarrhea or constipation.  Patient reports she does not recall her last LMP, and this is a regular due to being on Depo-Provera.  No abdominal surgical history.  Patient denies any recent immobilization, hospitalization, history DVT/PE, estrogen use, recent surgeries, hemoptysis, shortness of breath, chest pain, lower extremity edema or calf tenderness.  Past Medical History:  Diagnosis Date  . Asthma   . Chlamydia   . Gonorrhea     There are no active problems to display for this patient.   Past Surgical History:  Procedure Laterality Date  . WISDOM TOOTH EXTRACTION       OB History    Gravida  1   Para      Term      Preterm      AB      Living        SAB      TAB      Ectopic      Multiple      Live Births               Home Medications    Prior to Admission medications   Not on File    Family History Family History    Problem Relation Age of Onset  . Hypertension Father     Social History Social History   Tobacco Use  . Smoking status: Former Smoker    Packs/day: 0.25    Years: 3.00    Pack years: 0.75  . Smokeless tobacco: Never Used  Substance Use Topics  . Alcohol use: Yes    Comment: occasional  . Drug use: No     Allergies   Patient has no known allergies.   Review of Systems Review of Systems  Constitutional: Negative for chills and fever.  HENT: Negative for congestion and sore throat.   Eyes: Negative for visual disturbance.  Respiratory: Negative for cough, chest tightness and shortness of breath.   Cardiovascular: Negative for chest pain, palpitations and leg swelling.  Gastrointestinal: Negative for abdominal pain, constipation, diarrhea, nausea and vomiting.  Genitourinary: Positive for flank pain. Negative for dysuria.  Musculoskeletal: Negative for back pain and myalgias.  Skin: Negative for rash.  Neurological: Negative for dizziness, syncope, light-headedness and headaches.     Physical Exam Updated Vital Signs BP 126/80 (BP Location: Left Arm)   Pulse (!) 104   Temp 98.2 F (  36.8 C) (Oral)   Resp 16   Ht 5\' 3"  (1.6 m)   Wt 66.2 kg   LMP 08/06/2017 (Approximate)   SpO2 99%   Breastfeeding? Unknown   BMI 25.86 kg/m   Physical Exam  Constitutional: She appears well-developed and well-nourished. No distress.  HENT:  Head: Normocephalic and atraumatic.  Mouth/Throat: Oropharynx is clear and moist.  Eyes: Pupils are equal, round, and reactive to light. Conjunctivae and EOM are normal.  Neck: Normal range of motion. Neck supple.  Cardiovascular: Normal rate, regular rhythm, S1 normal and S2 normal.  No murmur heard. No lower extremity edema or calf tenderness.  Pulmonary/Chest: Effort normal and breath sounds normal. She has no wheezes. She has no rales.  Abdominal: Soft. She exhibits no distension. There is tenderness. There is no guarding.  Tenderness  to palpation of the right lateral abdomen inferior to the ribs.  Negative Murphy sign.  No CVA TTP.   Genitourinary:  Genitourinary Comments: Pelvic examination performed with RN chaperone present.  No lymphadenopathy of the inguinal region.  No lesions of the vagina or perineum.  Vaginal tissue is pink and rugated.  Cervix not erythematous and nonfriable.  Scant discharge within the vaginal vault. On bimanual exam, patient exhibits no cervical motion tenderness, uterine fundus, nor adnexal tenderness.  Musculoskeletal: Normal range of motion. She exhibits no edema or deformity.  Neurological: She is alert.  Cranial nerves grossly intact. Patient moves extremities symmetrically and with good coordination.  Skin: Skin is warm and dry. No rash noted. No erythema.  Psychiatric: She has a normal mood and affect. Her behavior is normal. Judgment and thought content normal.  Nursing note and vitals reviewed.    ED Treatments / Results  Labs (all labs ordered are listed, but only abnormal results are displayed) Labs Reviewed  WET PREP, GENITAL - Abnormal; Notable for the following components:      Result Value   Clue Cells Wet Prep HPF POC PRESENT (*)    WBC, Wet Prep HPF POC FEW (*)    All other components within normal limits  URINALYSIS, ROUTINE W REFLEX MICROSCOPIC - Abnormal; Notable for the following components:   APPearance CLOUDY (*)    All other components within normal limits  PREGNANCY, URINE  COMPREHENSIVE METABOLIC PANEL  CBC WITH DIFFERENTIAL/PLATELET  GC/CHLAMYDIA PROBE AMP ([place]) NOT AT [facility]    EKG None  Radiology US Abdomen Limited Ruq  Result Date: 12/29/2017 CLINICAL DATA:  Right upper quadrant pain. EXAM: ULTRASOUND ABDOMEN LIMITED RIGHT UPPER QUADRANT COMPARISON:  None. FINDINGS: Gallbladder: Physiologically distended. No gallstones or wall thickening visualized. No sonographic Murphy sign noted by sonographer. Common bile duct: Diameter: 2 mm, normal.  Liver: Echogenic 1.1 x 1.2 x 1.5 cm lesion in the right lobe of the liver. Within normal limits in parenchymal echogenicity. Portal vein is patent on color Doppler imaging with normal direction of blood flow towards the liver. IMPRESSION: 1. Normal sonographic appearance of the gallbladder and biliary tree. No gallstones. 2. Echogenic 1.5 cm lesion in the central liver is likely a hemangioma, but nonspecific. In the absence of malignancy history recommend follow-up ultrasound in 6 months to confirm stability. If there is history of malignancy, recommend MRI characterization. Electronically Signed   By: Narda Rutherford M.D.   On: 12/29/2017 21:23    Procedures Procedures (including critical care time)  Medications Ordered in ED Medications - No data to display   Initial Impression / Assessment and Plan / ED Course  I  have reviewed the triage vital signs and the nursing notes.  Pertinent labs & imaging results that were available during my care of the patient were reviewed by me and considered in my medical decision making (see chart for details).  Clinical Course as of Dec 30 20  Tue Dec 29, 2017  1937 HR 82 on my examination.   [AM]  2025 Pt not having vaginal dc and none on exam. Do not feel that this needs to be treated.   Wet prep, genital(!) [AM]  2230 Patient reassessed.  Patient comfortable at rest, however reports exquisite pain with movement.  Engaged in shared decision-making with patient regarding differential diagnosis and utility of CT scan.  Discussed with patient that deferring CT scan and treating conservatively with close return precautions is reasonable.  Patient discussed that she was to like to obtain CT scan.  Stated with patient this is reasonable.   [AM]    Clinical Course User Index [AM] Elisha PonderMurray, [place] Christman B, PA-C    Patient nontoxic-appearing, afebrile, and with nonsurgical abdomen.  Differential diagnosis includes Coley cystitis, cholelithiasis, pyelonephritis,  nephrolithiasis, appendicitis, PID.  Doubtful of PID based on pelvic examination, minimal vaginal discharge, and no adnexal tenderness.  Do not suspect pulmonary embolism as cause of flank pain, as patient's pain is much inferior to the lungs, patient is stable vital signs, and no risk factors for PE.  Work-up thus far reassuring.  Pelvic exam not concerning for PID.  Minimal vaginal discharge, wet prep showing clue cells, however with no vaginal discharge, do not feel that this needs to be treated.  No signs of urinary tract infection.  Pregnancy test is negative.  CMP and CBC are without abnormality.  Right upper quadrant ultrasound without any gallbladder wall thickening, gallstones, or CBD dilatation.  Patient does have possible hemangioma, and radiology recommends repeat ultrasound in 6 months.  Patient was informed of these results and states she will follow with her PCP.  After shared decision-making, patient elected to continue work-up with CT scan.  Care signed out to Dr. Brock BadLane Molpus, MD to follow results of CT scan.  Patient updated on plan of care and in agreement.  Final Clinical Impressions(s) / ED Diagnoses   Final diagnoses:  RUQ pain  Right flank pain    ED Discharge Orders    None       Delia ChimesMurray, Seidy Labreck B, PA-C 12/30/17 0035    Paula LibraMolpus, John, MD 12/30/17 0110    Paula LibraMolpus, John, MD 12/30/17 0110

## 2017-12-29 NOTE — ED Notes (Signed)
Pt reports right sided flank pain that started on Friday. Pt reports going to UC yesterday and being referred to the ED. Pt states [facility], waited, and left AMA because of the wait. Pt states today her symptoms did not get any better and she came in her for evaluation.

## 2017-12-30 LAB — GC/CHLAMYDIA PROBE AMP (~~LOC~~) NOT AT ARMC
Chlamydia: NEGATIVE
Neisseria Gonorrhea: NEGATIVE

## 2017-12-30 MED ORDER — METHOCARBAMOL 500 MG PO TABS: 500.0000 mg | ORAL_TABLET | Freq: Two times a day (BID) | ORAL | 0 refills | Status: DC

## 2017-12-30 MED ORDER — NAPROXEN 375 MG PO TABS: 375.0000 mg | ORAL_TABLET | Freq: Two times a day (BID) | ORAL | 0 refills | Status: DC

## 2017-12-30 NOTE — Discharge Instructions (Signed)
Please see the information and instructions below regarding your visit.  Your diagnoses today include:  1. Right flank pain   2. RUQ pain   3. Liver lesion     Your exam and testing today is reassuring that there is not a condition causing your abdominal pain that we immediately need to intervene on at this time.   Abdominal (belly) pain can be caused by many things. Your caregiver performed an examination and possibly ordered blood/urine tests and imaging (CT scan, x-rays, ultrasound). Many cases can be observed and treated at home after initial evaluation in the emergency department. Even though you are being discharged home, abdominal pain can be unpredictable. Therefore, you need a repeated exam if your pain does not resolve, returns, or worsens. Most patients with abdominal pain don't have to be admitted to the hospital or have surgery, but serious problems like appendicitis and gallbladder attacks can start out as nonspecific pain. Many abdominal conditions cannot be diagnosed in one visit, so follow-up evaluations are very important.  Tests performed today include: Blood counts and electrolytes Blood tests to check liver and kidney function Blood tests to check pancreas function Urine test to look for infection and pregnancy (in women) Vital signs. See below for your results today.   See side panel of your discharge paperwork for testing performed today. Vital signs are listed at the bottom of these instructions.   Medications prescribed:    Take any prescribed medications only as prescribed, and any over the counter medications only as directed on the packaging.  You are prescribed Robaxin, a muscle relaxant. Some common side effects of this medication include:  Feeling sleepy.  Dizziness. Take care upon going from a seated to a standing position.  Dry mouth.  Feeling tired or weak.  Hard stools (constipation).  Upset stomach. These are not all of the side effects that may  occur. If you have questions about side effects, call your doctor. Call your primary care provider for medical advice about side effects.  This medication can be sedating. Only take this medication as needed. Please do not combine with alcohol. Do not drive or operate machinery while taking this medication.   This medication can interact with some other medications. Make sure to tell any provider you are taking this medication before they prescribe you a new medication.   You are prescribed naproxen, a non-steroidal anti-inflammatory agent (NSAID) for pain. You may take 375mg  every 12  hours as needed for pain. If still requiring this medication around the clock for acute pain after 10 days, please see your primary healthcare provider.  Women who are pregnant, breastfeeding, or planning on becoming pregnant should not take non-steroidal anti-inflammatories such as Advil and Aleve. Tylenol is a safe over the counter pain reliever in pregnant women.  You may combine this medication with Tylenol, 650 mg every 6 hours, so you are receiving something for pain every 3 hours.  This is not a long-term medication unless under the care and direction of your primary provider. Taking this medication long-term and not under the supervision of a healthcare provider could increase the risk of stomach ulcers, kidney problems, and cardiovascular problems such as high blood pressure.   Home care instructions:  Try eating, but start with foods that have a lot of fluid in them. Good examples are soup, Jell-O, and popsicles. If you do OK with those foods, you can try soft, bland foods, such as plain yogurt. Foods that are high in carbohydrates ("  carbs"), like bread or saltine crackers, can help settle your stomach. Some people also find that ginger helps with nausea. You should avoid foods that have a lot of fat in them. They can make nausea worse. Call your doctor if your symptoms come back when you try to eat.  Please  follow any educational materials contained in this packet.   Follow-up instructions: Please follow-up with your primary care provider in 5-7 days for further evaluation of your symptoms if they are not completely improved.   Please follow up with your primary care provider in 6 months for repeat liver ultrasound.  Return instructions:  Please return to the Emergency Department if you experience worsening symptoms.  SEEK IMMEDIATE MEDICAL ATTENTION IF: The pain does not go away or becomes severe  A temperature above 101F develops  Repeated vomiting occurs (multiple episodes)  The pain becomes localized to portions of the abdomen. The right side could possibly be appendicitis. In an adult, the left lower portion of the abdomen could be colitis or diverticulitis.  Blood is being passed in stools or vomit (bright red or black tarry stools)  You develop chest pain, difficulty breathing, dizziness or fainting, or become confused, poorly responsive, or inconsolable (young children) If you have any other emergent concerns regarding your health  Additional Information:   Your vital signs today were: BP 120/68 (BP Location: Left Arm)    Pulse 68    Temp 98.2 F (36.8 C) (Oral)    Resp 16    Ht 5\' 3"  (1.6 m)    Wt 66.2 kg    LMP 08/06/2017 (Approximate)    SpO2 100%    Breastfeeding? Unknown    BMI 25.86 kg/m  If your blood pressure (BP) was elevated on multiple readings during this visit above 130 for the top number or above 80 for the bottom number, please have this repeated by your primary care provider within one month. --------------  Thank you for allowing [place] to participate in your care today.

## 2017-12-30 NOTE — ED Notes (Signed)
Patient verbalizes understanding of discharge instructions. Opportunity for questioning and answers were provided. Armband removed by staff, pt discharged from ED home with family via POV.  

## 2018-01-11 ENCOUNTER — Other Ambulatory Visit: Payer: Self-pay | Admitting: Family Medicine

## 2018-01-11 DIAGNOSIS — E049 Nontoxic goiter, unspecified: Secondary | ICD-10-CM

## 2018-03-15 ENCOUNTER — Other Ambulatory Visit: Payer: Self-pay | Admitting: Gastroenterology

## 2018-03-15 ENCOUNTER — Ambulatory Visit
Admission: RE | Admit: 2018-03-15 | Discharge: 2018-03-15 | Disposition: A | Discharge status: Home / Self Care | Payer: 59 | Source: Ambulatory Visit | Attending: Gastroenterology | Admitting: Gastroenterology

## 2018-03-15 DIAGNOSIS — R109 Unspecified abdominal pain: Secondary | ICD-10-CM

## 2018-10-20 ENCOUNTER — Ambulatory Visit (HOSPITAL_COMMUNITY)
Admission: EM | Admit: 2018-10-20 | Discharge: 2018-10-20 | Disposition: A | Discharge status: Home / Self Care | Payer: 59 | Attending: Emergency Medicine | Admitting: Emergency Medicine

## 2018-10-20 ENCOUNTER — Other Ambulatory Visit: Payer: Self-pay

## 2018-10-20 ENCOUNTER — Encounter (HOSPITAL_COMMUNITY): Payer: Self-pay

## 2018-10-20 DIAGNOSIS — Z87891 Personal history of nicotine dependence: Secondary | ICD-10-CM | POA: Insufficient documentation

## 2018-10-20 DIAGNOSIS — Z20828 Contact with and (suspected) exposure to other viral communicable diseases: Secondary | ICD-10-CM | POA: Insufficient documentation

## 2018-10-20 DIAGNOSIS — Z1159 Encounter for screening for other viral diseases: Secondary | ICD-10-CM

## 2018-10-20 DIAGNOSIS — J029 Acute pharyngitis, unspecified: Secondary | ICD-10-CM | POA: Insufficient documentation

## 2018-10-20 DIAGNOSIS — Z20822 Contact with and (suspected) exposure to covid-19: Secondary | ICD-10-CM

## 2018-10-20 DIAGNOSIS — J069 Acute upper respiratory infection, unspecified: Secondary | ICD-10-CM

## 2018-10-20 DIAGNOSIS — Z8249 Family history of ischemic heart disease and other diseases of the circulatory system: Secondary | ICD-10-CM | POA: Diagnosis not present

## 2018-10-20 DIAGNOSIS — R52 Pain, unspecified: Secondary | ICD-10-CM | POA: Diagnosis present

## 2018-10-20 LAB — POCT RAPID STREP A: Streptococcus, Group A Screen (Direct): NEGATIVE

## 2018-10-20 MED ORDER — FLUTICASONE PROPIONATE 50 MCG/ACT NA SUSP: 1.0000 | Freq: Every day | NASAL | 0 refills | Status: DC

## 2018-10-20 MED ORDER — GUAIFENESIN ER 600 MG PO TB12: 1200.0000 mg | ORAL_TABLET | Freq: Two times a day (BID) | ORAL | 0 refills | Status: DC | PRN

## 2018-10-20 NOTE — ED Provider Notes (Signed)
[facility]    CSN: [id number] Arrival date & time: 10/20/18  1216      History   Chief Complaint Chief Complaint  Patient presents with  . Generalized Body Aches  . Nasal Congestion  . Sore Throat    HPI Sandra Morris is a 23 y.o. female.   Patient presents with a one-week history of nasal congestion, runny nose, sneezing, sore throat, body aches, headache, nonproductive cough.  She denies fever, shortness of breath, abdominal pain, vomiting, diarrhea, rash, or other symptoms.  LMP: 1 week.    The history is provided by the patient.    Past Medical History:  Diagnosis Date  . Asthma   . Chlamydia   . Gonorrhea     There are no active problems to display for this patient.   Past Surgical History:  Procedure Laterality Date  . WISDOM TOOTH EXTRACTION      OB History    Gravida  1   Para      Term      Preterm      AB      Living        SAB      TAB      Ectopic      Multiple      Live Births               Home Medications    Prior to Admission medications   Medication Sig Start Date End Date Taking? Authorizing Provider  fluticasone (FLONASE) 50 MCG/ACT nasal spray Place 1 spray into both nostrils daily. 10/20/18   Mickie Bail, NP  guaiFENesin (MUCINEX) 600 MG 12 hr tablet Take 2 tablets (1,200 mg total) by mouth 2 (two) times daily as needed. 10/20/18   Mickie Bail, NP  methocarbamol (ROBAXIN) 500 MG tablet Take 1 tablet (500 mg total) by mouth 2 (two) times daily. 12/30/17   Aviva Kluver B, PA-C  naproxen (NAPROSYN) 375 MG tablet Take 1 tablet (375 mg total) by mouth 2 (two) times daily. 12/30/17   Elisha Ponder, PA-C    Family History Family History  Problem Relation Age of Onset  . Healthy Mother   . Hypertension Father     Social History Social History   Tobacco Use  . Smoking status: Former Smoker    Packs/day: 0.25    Years: 3.00    Pack years: 0.75  . Smokeless tobacco: Never Used  Substance Use  Topics  . Alcohol use: Yes    Comment: occasional  . Drug use: No     Allergies   Patient has no known allergies.   Review of Systems Review of Systems  Constitutional: Negative for chills and fever.  HENT: Positive for congestion, rhinorrhea and sore throat. Negative for ear pain and trouble swallowing.   Eyes: Negative for pain and visual disturbance.  Respiratory: Positive for cough. Negative for shortness of breath.   Cardiovascular: Negative for chest pain and palpitations.  Gastrointestinal: Negative for abdominal pain, diarrhea and vomiting.  Genitourinary: Negative for dysuria and hematuria.  Musculoskeletal: Negative for arthralgias and back pain.  Skin: Negative for color change and rash.  Neurological: Positive for headaches. Negative for seizures and syncope.  All other systems reviewed and are negative.    Physical Exam Triage Vital Signs ED Triage Vitals  Enc Vitals Group     BP 10/20/18 1237 124/64     Pulse Rate 10/20/18 1237 99  Resp 10/20/18 1237 16     Temp 10/20/18 1237 98.5 F (36.9 C)     Temp Source 10/20/18 1237 Oral     SpO2 10/20/18 1237 100 %     Weight --      Height --      Head Circumference --      Peak Flow --      Pain Score 10/20/18 1235 5     Pain Loc --      Pain Edu? --      Excl. in [place]? --    No data found.  Updated Vital Signs BP 124/64 (BP Location: Left Arm)   Pulse 99   Temp 98.5 F (36.9 C) (Oral)   Resp 16   SpO2 100%   Visual Acuity Right Eye Distance:   Left Eye Distance:   Bilateral Distance:    Right Eye Near:   Left Eye Near:    Bilateral Near:     Physical Exam Vitals signs and nursing note reviewed.  Constitutional:      General: She is not in acute distress.    Appearance: She is well-developed.  HENT:     Head: Normocephalic and atraumatic.     Right Ear: Tympanic membrane normal.     Left Ear: Tympanic membrane normal.     Nose: Congestion and rhinorrhea present.     Mouth/Throat:      Mouth: Mucous membranes are moist.     Pharynx: Posterior oropharyngeal erythema present. No oropharyngeal exudate.  Eyes:     Conjunctiva/sclera: Conjunctivae normal.  Neck:     Musculoskeletal: Neck supple.  Cardiovascular:     Rate and Rhythm: Normal rate and regular rhythm.     Heart sounds: No murmur.  Pulmonary:     Effort: Pulmonary effort is normal. No respiratory distress.     Breath sounds: Normal breath sounds.  Abdominal:     General: Bowel sounds are normal.     Palpations: Abdomen is soft.     Tenderness: There is no abdominal tenderness. There is no guarding or rebound.  Skin:    General: Skin is warm and dry.     Findings: No rash.  Neurological:     Mental Status: She is alert.      UC Treatments / Results  Labs (all labs ordered are listed, but only abnormal results are displayed) Labs Reviewed  NOVEL CORONAVIRUS, NAA (HOSP ORDER, SEND-OUT TO REF LAB; TAT 18-24 HRS)  CULTURE, GROUP A STREP [facility])  POCT RAPID STREP A    EKG   Radiology No results found.  Procedures Procedures (including critical care time)  Medications Ordered in UC Medications - No data to display  Initial Impression / Assessment and Plan / UC Course  I have reviewed the triage vital signs and the nursing notes.  Pertinent labs & imaging results that were available during my care of the patient were reviewed by me and considered in my medical decision making (see chart for details).   Upper respiratory infection, sore throat, suspected COVID.  Rapid strep negative; culture pending.  Treating with Flonase and Mucinex.  COVID test performed here.  Instructed patient to self quarantine until her test results are back.  Instructed patient to go to the emergency department if she develops high fever, shortness of breath, severe diarrhea, or other concerning symptoms.  Patient agrees with plan of care.    Final Clinical Impressions(s) / UC Diagnoses   Final diagnoses:  Viral upper  respiratory tract infection  Sore throat  Suspected Covid-19 Virus Infection     Discharge Instructions     Your rapid strep test is negative.  A throat culture is pending; we will call you if it is positive requiring treatment.     Use the Flonase nasal spray as directed.  Take the Mucinex as directed.  Your COVID test is pending.  You should self quarantine until your test result is back and is negative.    Go to the emergency department if you develop high fever, shortness of breath, severe diarrhea, or other concerning symptoms.        ED Prescriptions    Medication Sig Dispense Auth. Provider   fluticasone (FLONASE) 50 MCG/ACT nasal spray Place 1 spray into both nostrils daily. 16 g Mickie Bailate, Jontez Redfield H, NP   guaiFENesin (MUCINEX) 600 MG 12 hr tablet Take 2 tablets (1,200 mg total) by mouth 2 (two) times daily as needed. 12 tablet Mickie Bailate, Delford Wingert H, NP     Controlled Substance Prescriptions [place] Controlled Substance Registry consulted? Not Applicable   Mickie Bailate, Cian Costanzo H, NP 10/20/18 1256

## 2018-10-20 NOTE — Discharge Instructions (Addendum)
Your rapid strep test is negative.  A throat culture is pending; we will call you if it is positive requiring treatment.     Use the Flonase nasal spray as directed.  Take the Mucinex as directed.  Your COVID test is pending.  You should self quarantine until your test result is back and is negative.    Go to the emergency department if you develop high fever, shortness of breath, severe diarrhea, or other concerning symptoms.

## 2018-10-20 NOTE — ED Triage Notes (Signed)
Patient presents to Urgent Care with complaints of a headache, which was followed by body aches, sore throat, and now nasal congestion since last week. Patient reports she has not had fevers or a cough, will need note to be out of work.

## 2018-10-21 LAB — NOVEL CORONAVIRUS, NAA (HOSP ORDER, SEND-OUT TO REF LAB; TAT 18-24 HRS): SARS-CoV-2, NAA: NOT DETECTED

## 2018-10-22 ENCOUNTER — Encounter (HOSPITAL_COMMUNITY): Payer: Self-pay

## 2018-10-22 LAB — CULTURE, GROUP A STREP (THRC)

## 2018-10-23 IMAGING — US US OB TRANSVAGINAL
1 series · 15 of 28 positions shown · non-contrast
Comparison: None.

CLINICAL DATA: 22-year-old female with positive pregnancy test and
pelvic pain. Estimated gestational age of 3 weeks 6 days by LMP.

EXAM:
TRANSVAGINAL OB ULTRASOUND
TECHNIQUE: Transvaginal ultrasound was performed for complete evaluation of the
gestation as well as the maternal uterus, adnexal regions, and
pelvic cul-de-sac.

[Series 1: us ob transvaginal · 15 of 32 slices shown]
[im 1/32]
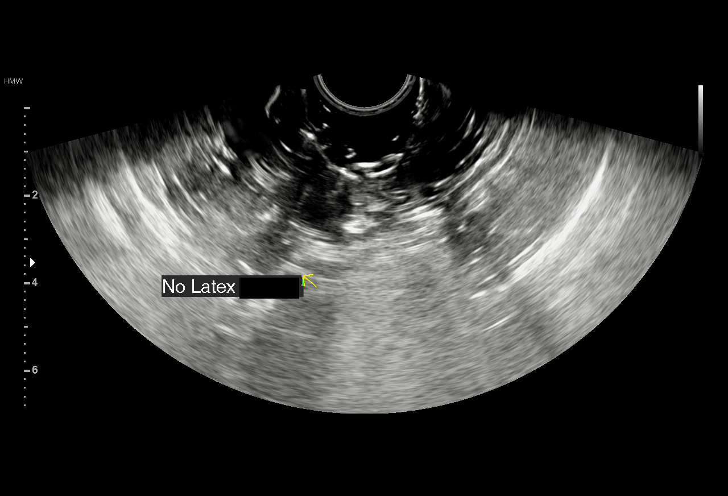
[im 3/32]
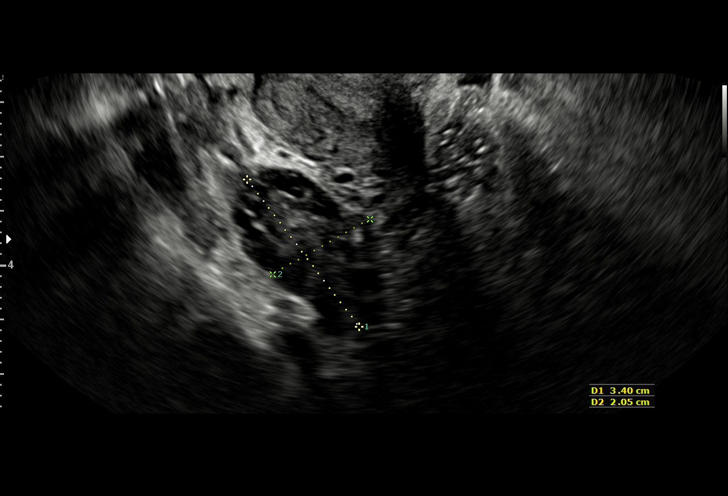
[im 5/32]
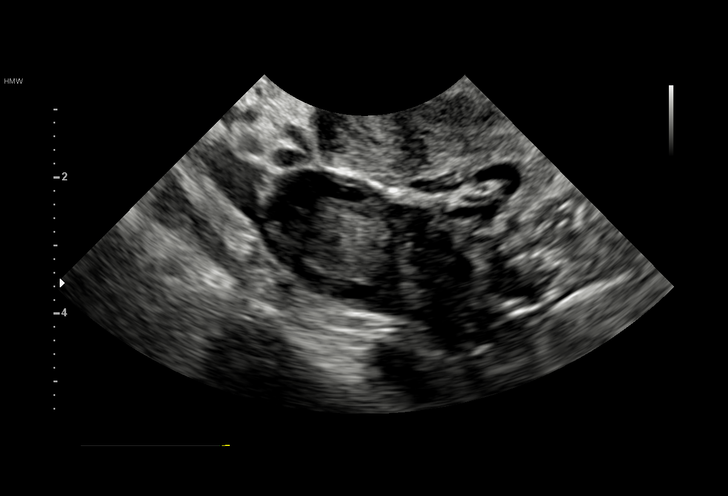
[im 7/32]
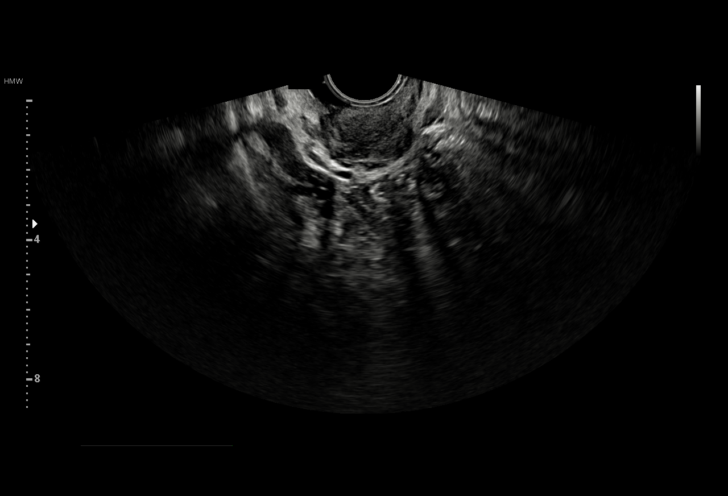
[im 10/32]
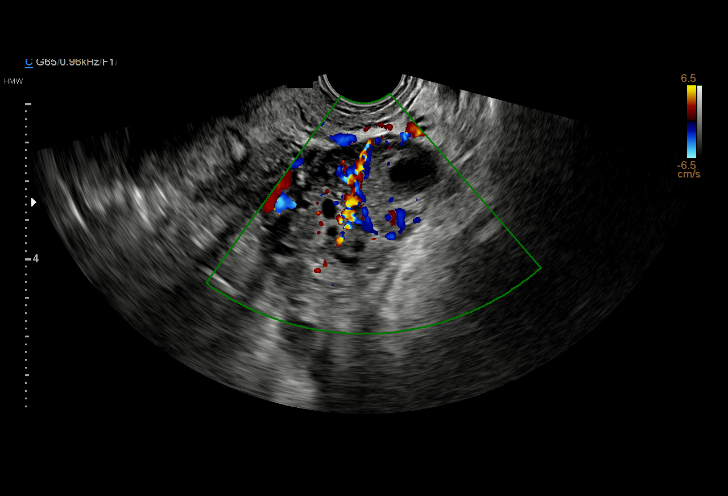
[im 12/32]
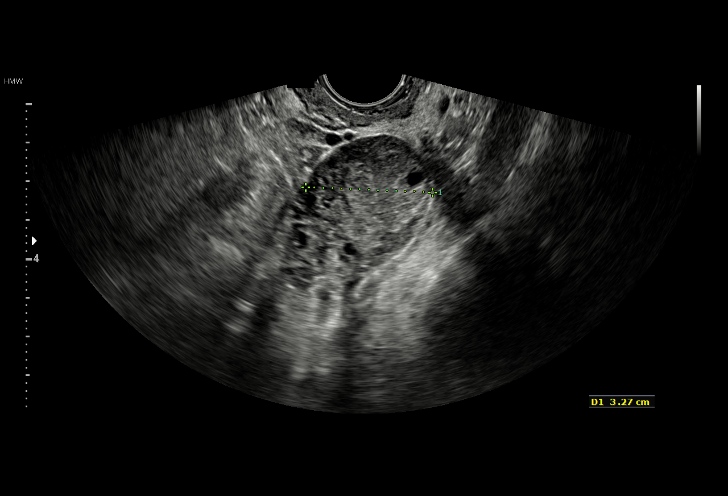
[im 14/32]
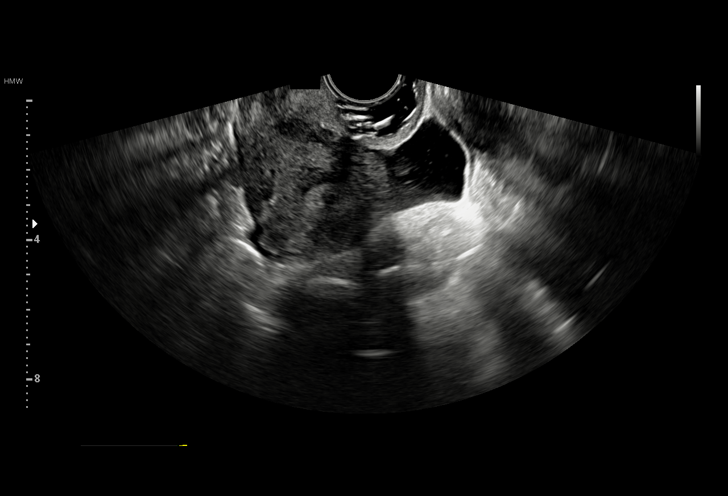
[im 17/32]
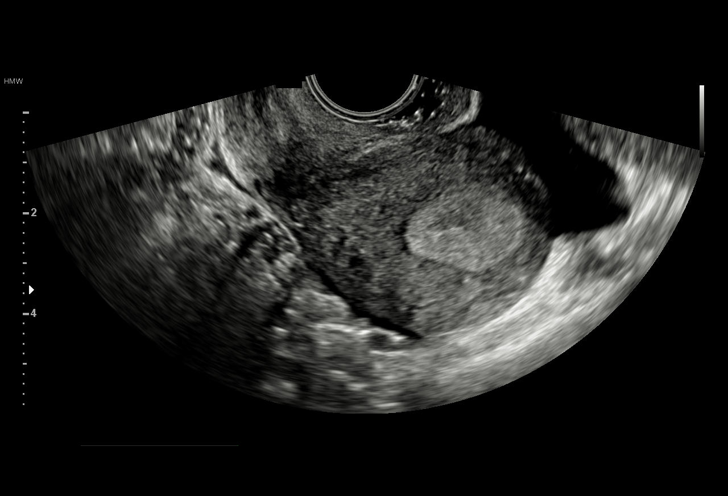
[im 18/32]
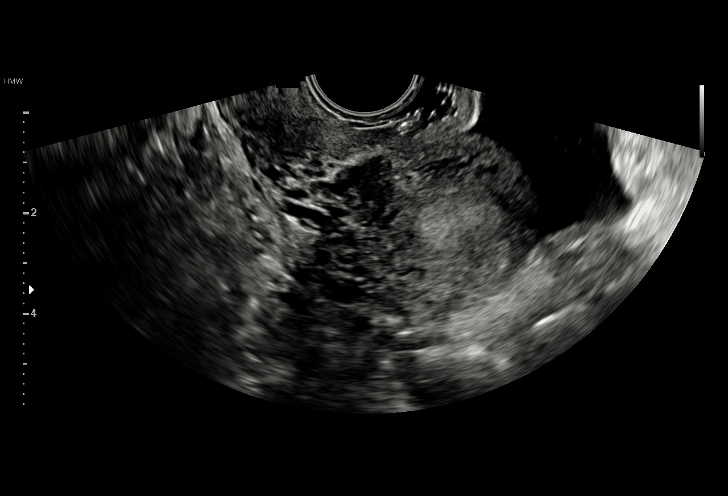
[im 20/32]
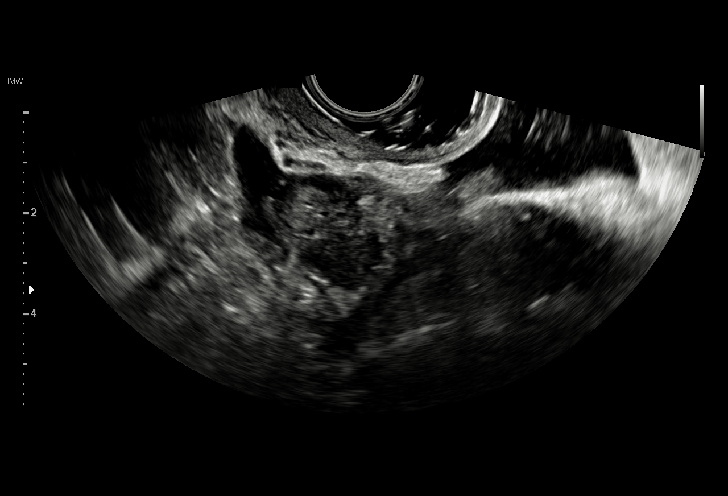
[im 22/32]
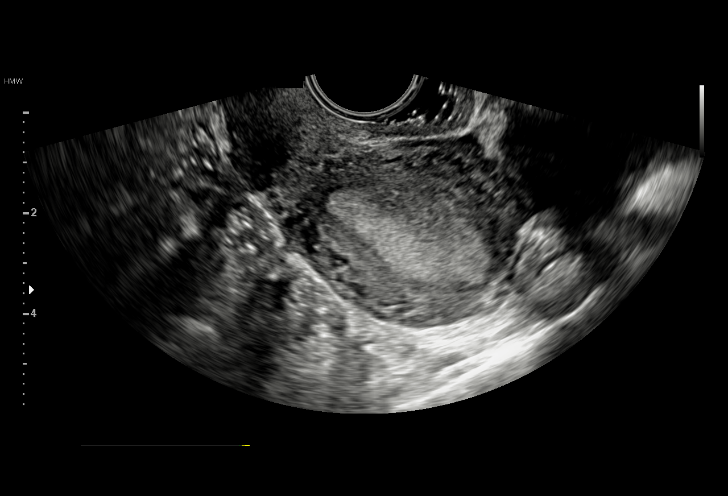
[im 25/32]
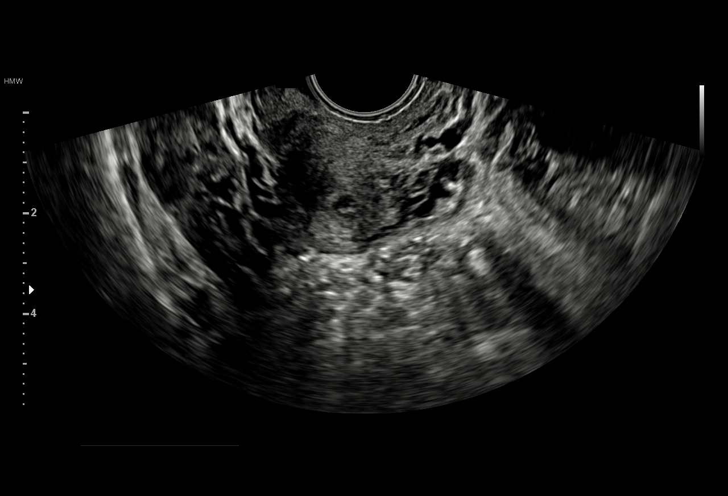
[im 27/32]
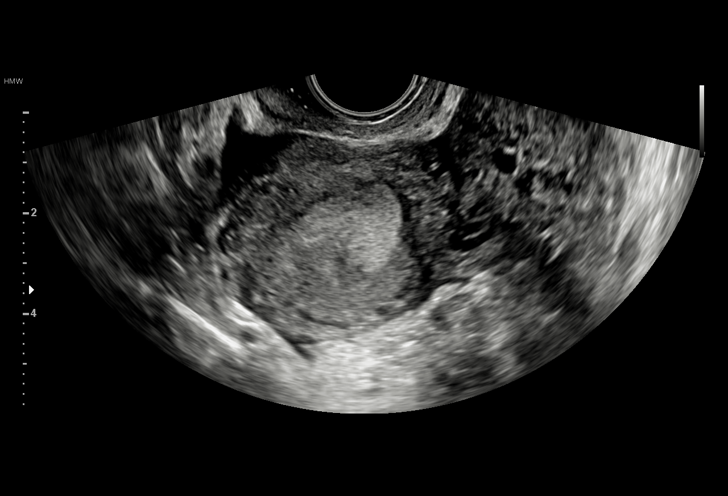
[im 29/32]
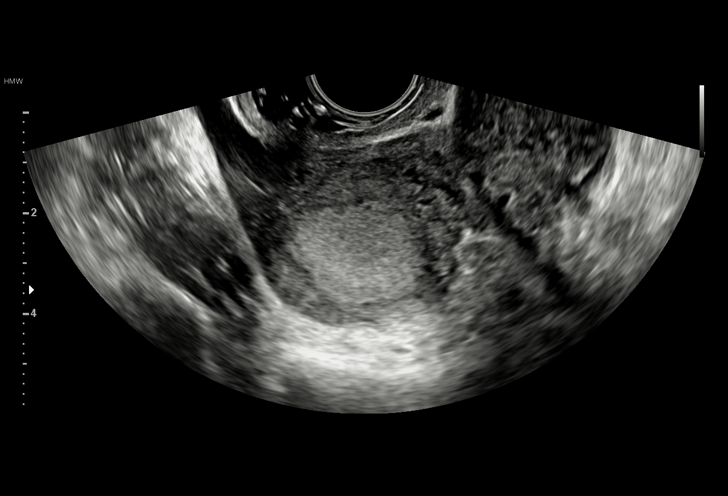
[im 32/32]
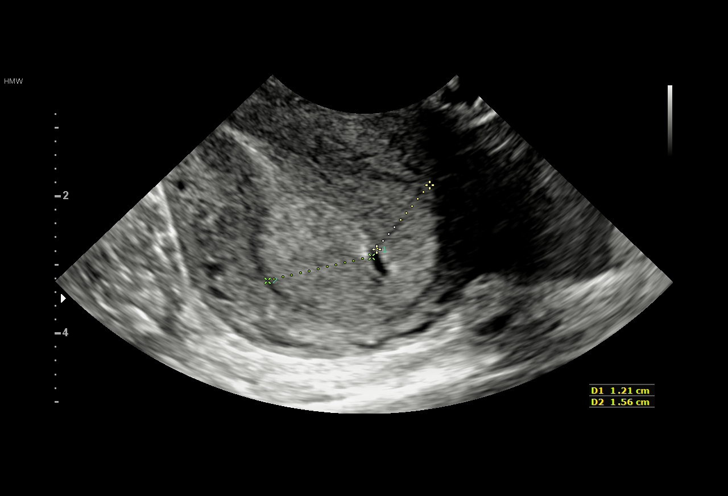

[15 of 28 positions shown; findings below may reference images not displayed]

FINDINGS: Intrauterine gestational sac: None

Yolk sac:  Not Visualized.

Embryo:  Not Visualized.

Maternal uterus/adnexae: The ovaries bilaterally are unremarkable.

Homogeneous endometrial thickening is noted measuring at least 2 cm.

A small amount of free pelvic fluid is noted. There is no evidence
of adnexal mass.
IMPRESSION: 1. No intrauterine gestational sac, yolk sac, fetal pole, or cardiac
activity visualized. Differential considerations include
intrauterine gestation too early to be sonographically visualized,
spontaneous abortion, or ectopic pregnancy. Consider follow-up
ultrasound in 10 days and serial quantitative beta HCG follow-up.
2. Small amount of nonspecific free pelvic fluid.  No adnexal mass.

## 2018-10-27 IMAGING — US US OB TRANSVAGINAL
1 series · 15 of 28 positions shown · non-contrast
Comparison: 08/30/2017

CLINICAL DATA: Positive pregnancy test.

EXAM:
OBSTETRIC <14 WK US AND TRANSVAGINAL OB US
TECHNIQUE: Both transabdominal and transvaginal ultrasound examinations were
performed for complete evaluation of the gestation as well as the
maternal uterus, adnexal regions, and pelvic cul-de-sac.
Transvaginal technique was performed to assess early pregnancy.

[Series 1: us ob transvaginal · 15 of 39 slices shown]
[im 1/39]
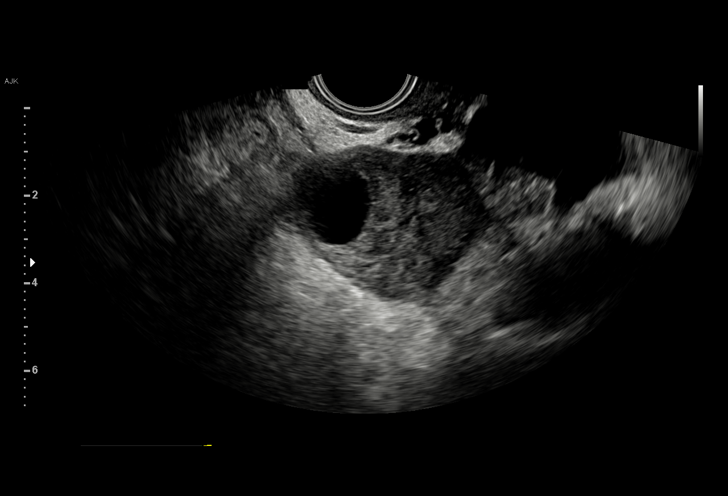
[im 3/39]
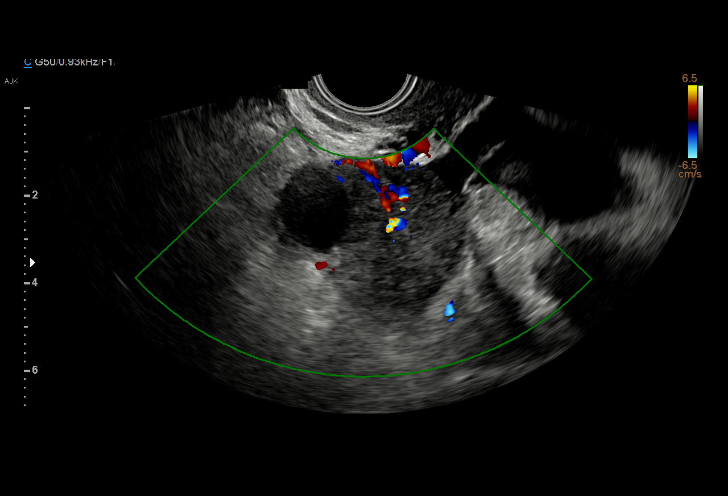
[im 6/39]
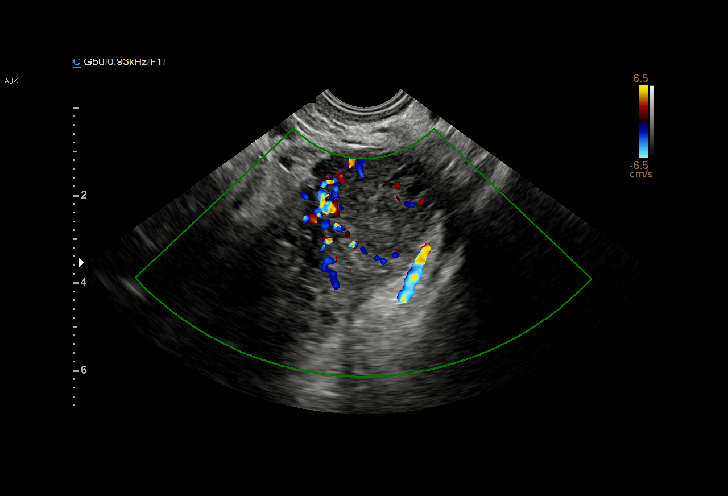
[im 9/39]
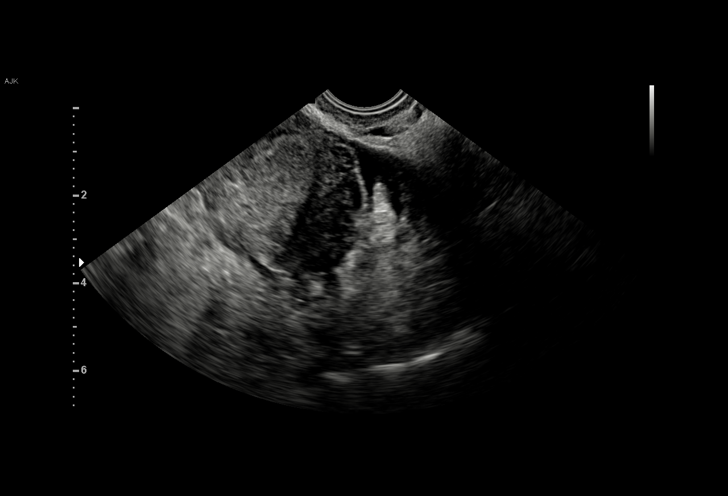
[im 12/39]
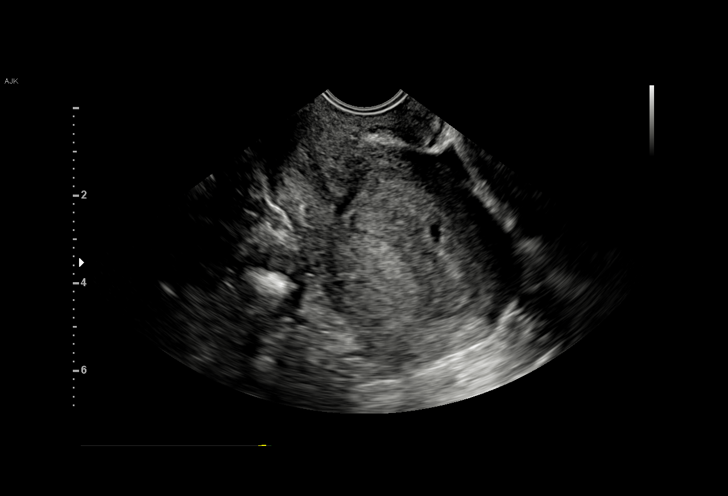
[im 15/39]
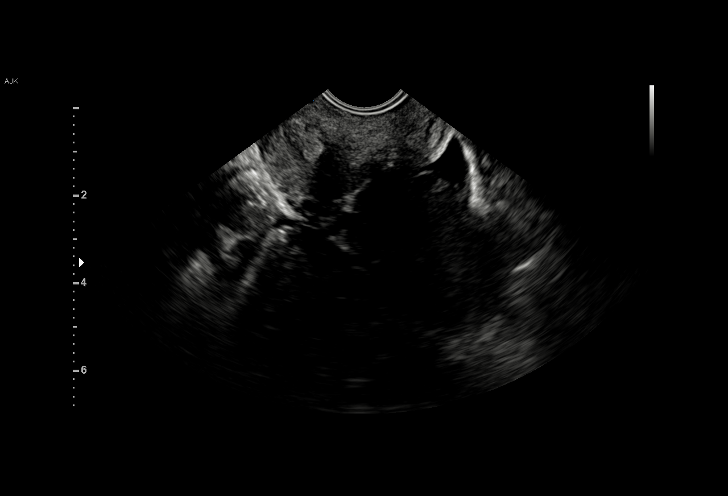
[im 17/39]
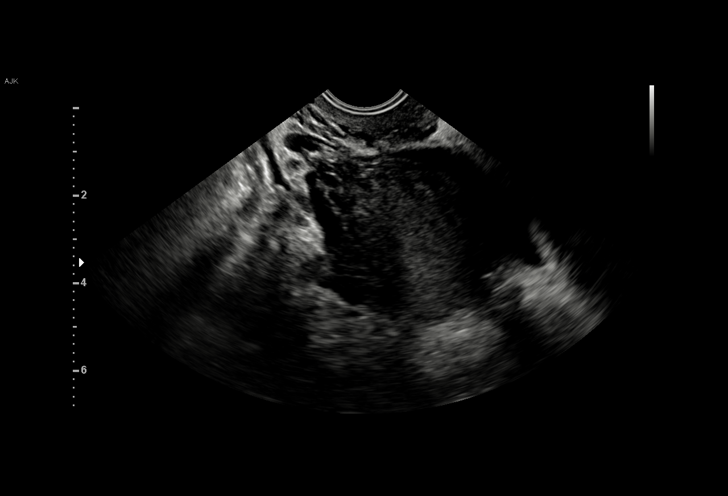
[im 20/39]
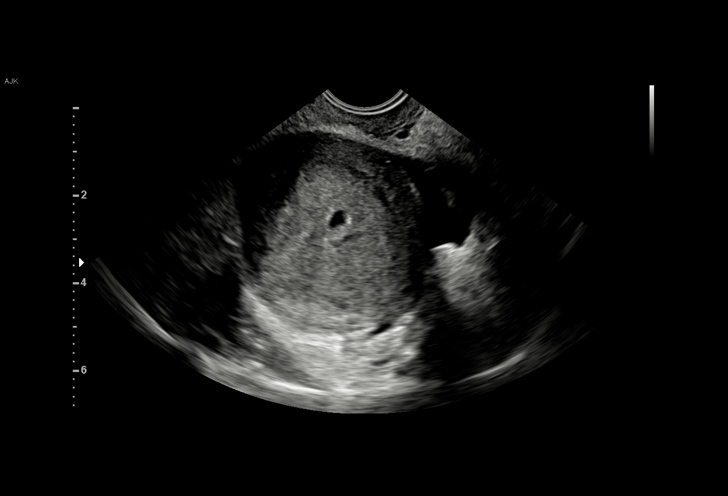
[im 22/39]
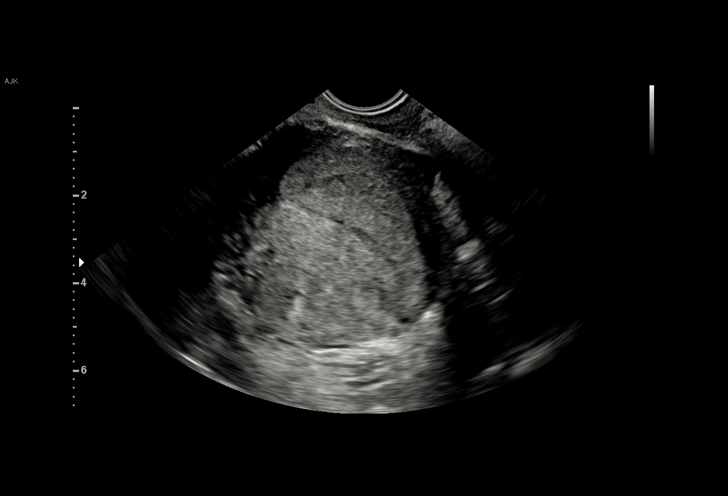
[im 24/39]
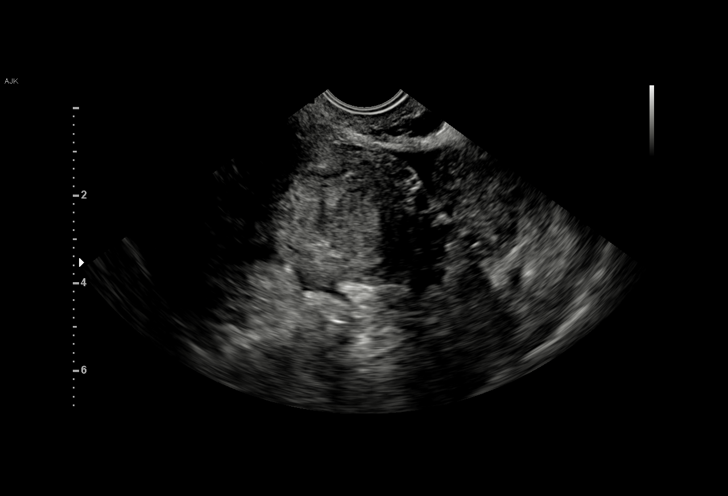
[im 27/39]
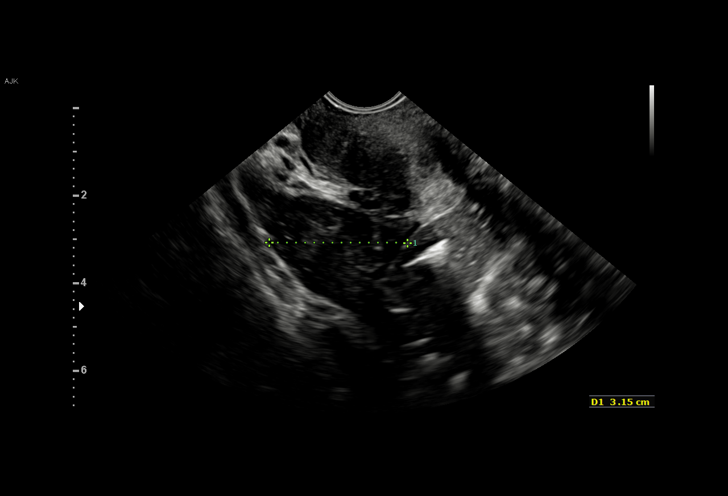
[im 30/39]
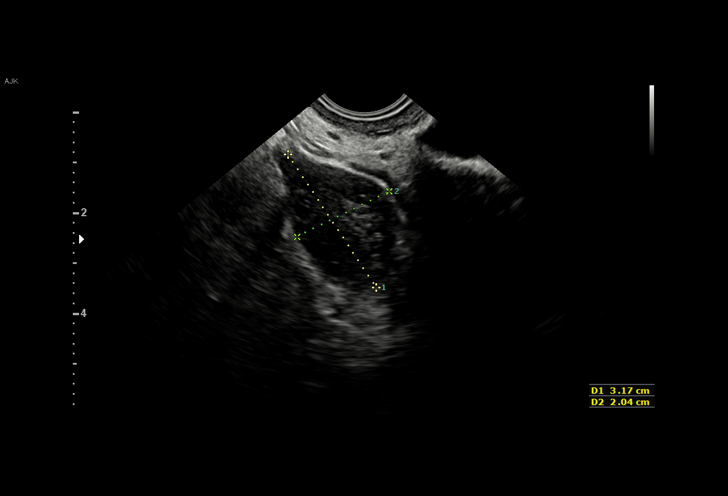
[im 33/39]
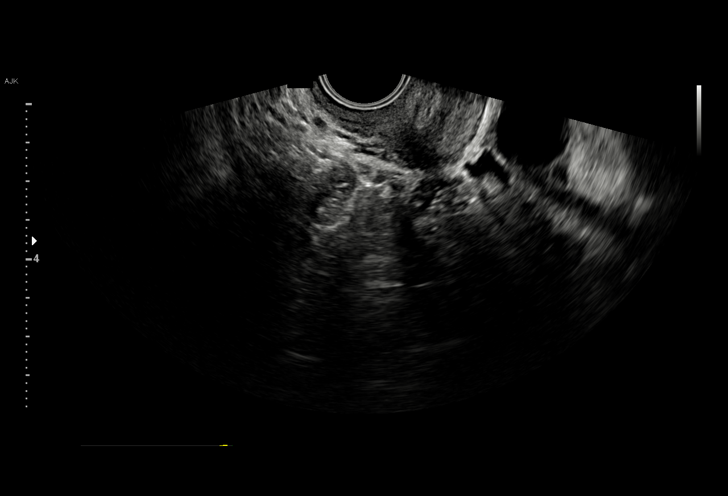
[im 36/39]
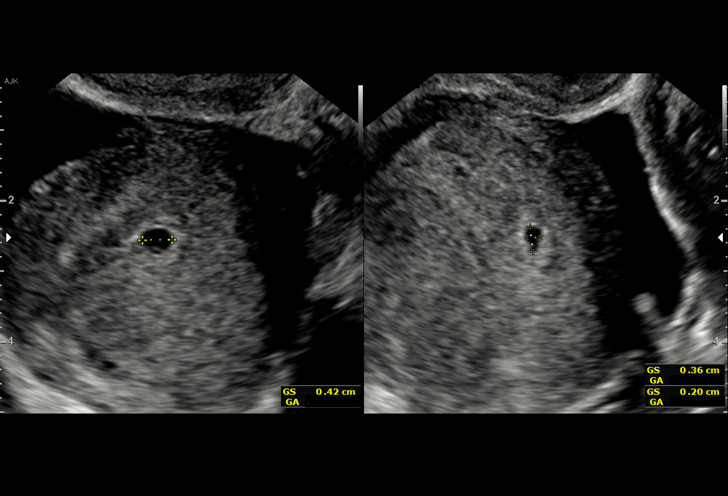
[im 39/39]
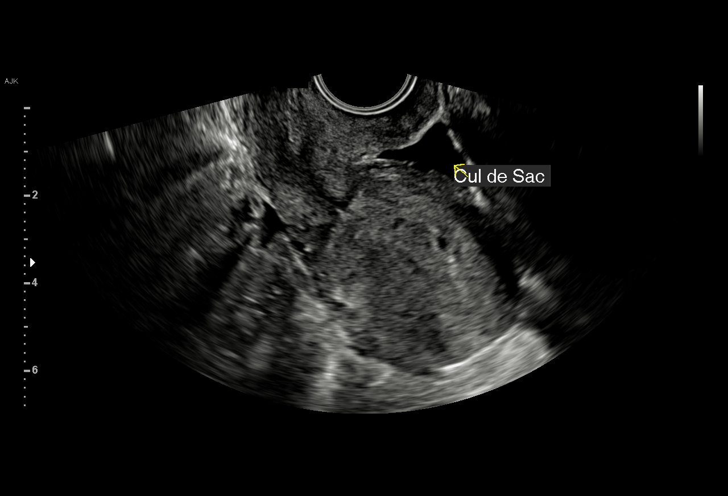

[15 of 28 positions shown; findings below may reference images not displayed]

FINDINGS: Intrauterine gestational sac: Tiny cystic focus projects in the
decidualized endometrium suggesting gestational sac. No yolk sac or
embryo yet evident.

Yolk sac:  Not visualized.

Embryo:  Not visualized.

MSD: 3.3 mm   5 w   0 d

Subchorionic hemorrhage:  None visualized.

Maternal uterus/adnexae: Unremarkable.  No adnexal mass.
IMPRESSION: 1. Tiny probable intrauterine gestational sac although no yolk sac
or embryo yet evident. Close continued follow-up recommended to
exclude nonvisualized ectopic gestation.

## 2018-12-07 ENCOUNTER — Other Ambulatory Visit: Payer: Self-pay

## 2018-12-07 ENCOUNTER — Encounter (HOSPITAL_COMMUNITY): Payer: Self-pay

## 2018-12-07 ENCOUNTER — Ambulatory Visit (HOSPITAL_COMMUNITY)
Admission: EM | Admit: 2018-12-07 | Discharge: 2018-12-07 | Disposition: A | Discharge status: Home / Self Care | Payer: 59 | Attending: Family Medicine | Admitting: Family Medicine

## 2018-12-07 DIAGNOSIS — Z3202 Encounter for pregnancy test, result negative: Secondary | ICD-10-CM

## 2018-12-07 DIAGNOSIS — Z7251 High risk heterosexual behavior: Secondary | ICD-10-CM | POA: Insufficient documentation

## 2018-12-07 DIAGNOSIS — N941 Unspecified dyspareunia: Secondary | ICD-10-CM | POA: Diagnosis not present

## 2018-12-07 LAB — POCT URINALYSIS DIP (DEVICE)
Bilirubin Urine: NEGATIVE
Glucose, UA: NEGATIVE mg/dL
Ketones, ur: NEGATIVE mg/dL
Leukocytes,Ua: NEGATIVE
Nitrite: NEGATIVE
Protein, ur: NEGATIVE mg/dL
Specific Gravity, Urine: 1.025 (ref 1.005–1.030)
Urobilinogen, UA: 0.2 mg/dL (ref 0.0–1.0)
pH: 7 (ref 5.0–8.0)

## 2018-12-07 LAB — POCT PREGNANCY, URINE: Preg Test, Ur: NEGATIVE

## 2018-12-07 MED ORDER — MELOXICAM 7.5 MG PO TABS: 7.5000 mg | ORAL_TABLET | Freq: Every day | ORAL | 0 refills | Status: DC

## 2018-12-07 NOTE — ED Provider Notes (Signed)
MRN: [id number] DOB: 1995-10-24  Subjective:   Sandra Morris is a 23 y.o. female presenting for several week history of intermittent moderate pelvic pain during sex.  Patient would like to get testing for STIs and check for pregnancy. She was in a monogamous relationship, was not using condoms for protection.  Has not tried medications for relief.  She does have a gynecologist but has not set up an appointment with them.  No current facility-administered medications for this encounter.   Current Outpatient Medications:  .  fluticasone (FLONASE) 50 MCG/ACT nasal spray, Place 1 spray into both nostrils daily., Disp: 16 g, Rfl: 0   No Known Allergies  Past Medical History:  Diagnosis Date  . Asthma   . Chlamydia   . Gonorrhea      Past Surgical History:  Procedure Laterality Date  . WISDOM TOOTH EXTRACTION      Review of Systems  Constitutional: Negative for chills, fever and malaise/fatigue.  HENT: Negative for sore throat.   Respiratory: Negative for cough and shortness of breath.   Cardiovascular: Negative for chest pain.  Gastrointestinal: Negative for abdominal pain, diarrhea, nausea and vomiting.  Genitourinary: Negative for dysuria, flank pain, frequency, hematuria and urgency.  Musculoskeletal: Negative for myalgias.  Skin: Negative for rash.  Neurological: Negative for dizziness, weakness and headaches.  Psychiatric/Behavioral: Negative for depression and substance abuse.    Objective:   Vitals: BP 133/72 (BP Location: Right Arm)   Pulse (!) 115   Temp 98.4 F (36.9 C) (Oral)   Resp 18   LMP 10/08/2018   SpO2 100%   Physical Exam Constitutional:      General: She is not in acute distress.    Appearance: Normal appearance. She is well-developed and normal weight. She is not ill-appearing, toxic-appearing or diaphoretic.  HENT:     Head: Normocephalic and atraumatic.     Right Ear: External ear normal.     Left Ear: External ear normal.     Nose: Nose  normal.     Mouth/Throat:     Mouth: Mucous membranes are moist.     Pharynx: Oropharynx is clear.  Eyes:     General: No scleral icterus.    Extraocular Movements: Extraocular movements intact.     Pupils: Pupils are equal, round, and reactive to light.  Cardiovascular:     Rate and Rhythm: Normal rate and regular rhythm.     Heart sounds: Normal heart sounds. No murmur. No friction rub. No gallop.   Pulmonary:     Effort: Pulmonary effort is normal. No respiratory distress.     Breath sounds: Normal breath sounds. No stridor. No wheezing, rhonchi or rales.  Abdominal:     General: Bowel sounds are normal. There is no distension.     Palpations: Abdomen is soft. There is no mass.     Tenderness: There is no abdominal tenderness. There is no right CVA tenderness, left CVA tenderness, guarding or rebound.  Skin:    General: Skin is warm and dry.     Coloration: Skin is not pale.     Findings: No rash.  Neurological:     General: No focal deficit present.     Mental Status: She is alert and oriented to person, place, and time.  Psychiatric:        Mood and Affect: Mood normal.        Behavior: Behavior normal.        Thought Content: Thought content normal.  Judgment: Judgment normal.    Results for orders placed or performed during the hospital encounter of 12/07/18 (from the past 24 hour(s))  POCT urinalysis dip (device)     Status: Abnormal   Collection Time: 12/07/18 11:15 AM  Result Value Ref Range   Glucose, UA NEGATIVE NEGATIVE mg/dL   Bilirubin Urine NEGATIVE NEGATIVE   Ketones, ur NEGATIVE NEGATIVE mg/dL   Specific Gravity, Urine 1.025 1.005 - 1.030   Hgb urine dipstick TRACE (A) NEGATIVE   pH 7.0 5.0 - 8.0   Protein, ur NEGATIVE NEGATIVE mg/dL   Urobilinogen, UA 0.2 0.0 - 1.0 mg/dL   Nitrite NEGATIVE NEGATIVE   Leukocytes,Ua NEGATIVE NEGATIVE  Pregnancy, urine POC     Status: None   Collection Time: 12/07/18 11:18 AM  Result Value Ref Range   Preg Test,  Ur NEGATIVE NEGATIVE    Assessment and Plan :   1. Dyspareunia in female   2. Unprotected sex     Counseled patient on differential which includes need to rule out endometriosis with her gynecologist, possible STI, uterine fibroids, ovarian cyst.  Patient has not previously responded to naproxen therefore will use meloxicam.  Labs pending.  Will treat based off results at patient's request. Counseled patient on potential for adverse effects with medications prescribed/recommended today, ER and return-to-clinic precautions discussed, patient verbalized understanding.    Wallis Bamberg, PA-C 12/07/18 1131

## 2018-12-07 NOTE — ED Triage Notes (Signed)
Pt presents for STD testing; pt states she is not having any "real" symptoms, she states she just has discomfort while having sex recently that she has not had before.  Pt would also like to get tested for possible pregnancy.

## 2018-12-13 LAB — CERVICOVAGINAL ANCILLARY ONLY
Bacterial vaginitis: POSITIVE — AB
Candida vaginitis: POSITIVE — AB
Chlamydia: NEGATIVE
Neisseria Gonorrhea: NEGATIVE
Trichomonas: NEGATIVE

## 2018-12-14 ENCOUNTER — Telehealth (HOSPITAL_COMMUNITY): Payer: Self-pay | Admitting: Emergency Medicine

## 2018-12-14 MED ORDER — METRONIDAZOLE 500 MG PO TABS: 500.0000 mg | ORAL_TABLET | Freq: Two times a day (BID) | ORAL | 0 refills | Status: AC

## 2018-12-14 MED ORDER — FLUCONAZOLE 150 MG PO TABS: 150.0000 mg | ORAL_TABLET | Freq: Once | ORAL | 0 refills | Status: AC

## 2018-12-14 NOTE — Telephone Encounter (Signed)
Bacterial vaginosis is positive. This was not treated at the urgent care visit.  Flagyl 500 mg BID x 7 days #14 no refills sent to patients pharmacy of choice.    Test for candida (yeast) was positive.  Prescription for fluconazole 150mg po now, repeat dose in 3d if needed, #2 no refills, sent to the pharmacy of record.  Recheck or followup with PCP for further evaluation if symptoms are not improving.    Patient contacted and made aware of    results, all questions answered   

## 2018-12-29 ENCOUNTER — Ambulatory Visit (HOSPITAL_COMMUNITY)
Admission: EM | Admit: 2018-12-29 | Discharge: 2018-12-29 | Disposition: A | Discharge status: Home / Self Care | Payer: 59 | Attending: Family Medicine | Admitting: Family Medicine

## 2018-12-29 ENCOUNTER — Other Ambulatory Visit: Payer: Self-pay

## 2018-12-29 ENCOUNTER — Encounter (HOSPITAL_COMMUNITY): Payer: Self-pay | Admitting: Family Medicine

## 2018-12-29 DIAGNOSIS — N730 Acute parametritis and pelvic cellulitis: Secondary | ICD-10-CM

## 2018-12-29 DIAGNOSIS — H02843 Edema of right eye, unspecified eyelid: Secondary | ICD-10-CM

## 2018-12-29 DIAGNOSIS — Z3200 Encounter for pregnancy test, result unknown: Secondary | ICD-10-CM | POA: Diagnosis not present

## 2018-12-29 DIAGNOSIS — H538 Other visual disturbances: Secondary | ICD-10-CM

## 2018-12-29 DIAGNOSIS — N898 Other specified noninflammatory disorders of vagina: Secondary | ICD-10-CM

## 2018-12-29 DIAGNOSIS — R300 Dysuria: Secondary | ICD-10-CM

## 2018-12-29 LAB — POCT URINALYSIS DIP (DEVICE)
Bilirubin Urine: NEGATIVE
Glucose, UA: NEGATIVE mg/dL
Ketones, ur: NEGATIVE mg/dL
Nitrite: NEGATIVE
Protein, ur: NEGATIVE mg/dL
Specific Gravity, Urine: 1.02 (ref 1.005–1.030)
Urobilinogen, UA: 0.2 mg/dL (ref 0.0–1.0)
pH: 7.5 (ref 5.0–8.0)

## 2018-12-29 MED ORDER — METRONIDAZOLE 500 MG PO TABS: 500.0000 mg | ORAL_TABLET | Freq: Two times a day (BID) | ORAL | 0 refills | Status: DC

## 2018-12-29 MED ORDER — LIDOCAINE HCL (PF) 1 % IJ SOLN
INTRAMUSCULAR | Status: AC
  Filled 2018-12-29: qty 2

## 2018-12-29 MED ORDER — CIPROFLOXACIN HCL 500 MG PO TABS: 500.0000 mg | ORAL_TABLET | Freq: Two times a day (BID) | ORAL | 0 refills | Status: DC

## 2018-12-29 MED ORDER — CEFTRIAXONE SODIUM 250 MG IJ SOLR
INTRAMUSCULAR | Status: AC
  Filled 2018-12-29: qty 250

## 2018-12-29 MED ORDER — CEFTRIAXONE SODIUM 250 MG IJ SOLR
250.0000 mg | Freq: Once | INTRAMUSCULAR | Status: AC
  Administered 2018-12-29: 250 mg via INTRAMUSCULAR

## 2018-12-29 MED ORDER — AZITHROMYCIN 250 MG PO TABS
1000.0000 mg | ORAL_TABLET | Freq: Once | ORAL | Status: AC
  Administered 2018-12-29: 1000 mg via ORAL

## 2018-12-29 MED ORDER — AZITHROMYCIN 250 MG PO TABS
ORAL_TABLET | ORAL | Status: AC
  Filled 2018-12-29: qty 4

## 2018-12-29 NOTE — ED Provider Notes (Signed)
[place]    CSN: [id number] Arrival date & time: 12/29/18  1214      History   Chief Complaint Chief Complaint  Patient presents with  . Eye Problem  . Dysphagia    HPI Sandra Morris is a 23 y.o. female.   Is a [facility] established patient, 23 year old woman, seen recently for STD check, complaining of right eye irritation.  Earlier this month, patient underwent U/A and STD testing.  The only positive results were BV and candida for which she was treated.  Pt states having swollen right eye and blurred vision in her right eye.   Pt wants a STD test, as she is having burning sensation when urinating and vaginal discharge x 2 days.       Past Medical History:  Diagnosis Date  . Asthma   . Chlamydia   . Gonorrhea     There are no active problems to display for this patient.   Past Surgical History:  Procedure Laterality Date  . WISDOM TOOTH EXTRACTION      OB History    Gravida  1   Para      Term      Preterm      AB      Living        SAB      TAB      Ectopic      Multiple      Live Births               Home Medications    Prior to Admission medications   Medication Sig Start Date End Date Taking? Authorizing Provider  ciprofloxacin (CIPRO) 500 MG tablet Take 1 tablet (500 mg total) by mouth 2 (two) times daily. 12/29/18   Robyn Haber, MD  fluticasone (FLONASE) 50 MCG/ACT nasal spray Place 1 spray into both nostrils daily. 10/20/18   Sharion Balloon, NP  meloxicam (MOBIC) 7.5 MG tablet Take 1 tablet (7.5 mg total) by mouth daily. 12/07/18   Jaynee Eagles, PA-C  metroNIDAZOLE (FLAGYL) 500 MG tablet Take 1 tablet (500 mg total) by mouth 2 (two) times daily. 12/29/18   Robyn Haber, MD    Family History Family History  Problem Relation Age of Onset  . Healthy Mother   . Hypertension Father     Social History Social History   Tobacco Use  . Smoking status: Former Smoker    Packs/day:  0.25    Years: 3.00    Pack years: 0.75  . Smokeless tobacco: Never Used  Substance Use Topics  . Alcohol use: Yes    Comment: occasional  . Drug use: No     Allergies   Patient has no known allergies.   Review of Systems Review of Systems  Eyes: Positive for pain.  Gastrointestinal: Negative for nausea and vomiting.  Genitourinary: Positive for pelvic pain and vaginal discharge.  All other systems reviewed and are negative.    Physical Exam Triage Vital Signs ED Triage Vitals  Enc Vitals Group     BP      Pulse      Resp      Temp      Temp src      SpO2      Weight      Height      Head Circumference      Peak Flow      Pain Score      Pain  Loc      Pain Edu?      Excl. in [place]?    No data found.  Updated Vital Signs BP (!) 104/53 (BP Location: Right Arm)   Pulse 98   Temp 98.4 F (36.9 C) (Oral)   Resp 14   LMP  (Within Weeks) Comment: 3 weeks ago aprox  SpO2 100%    Physical Exam Vitals signs and nursing note reviewed.  Constitutional:      General: She is not in acute distress.    Appearance: Normal appearance. She is normal weight.  HENT:     Head: Normocephalic.  Eyes:     Conjunctiva/sclera: Conjunctivae normal.     Comments: Tender upper right eyelid with slight swelling at lid margin  Neck:     Musculoskeletal: Normal range of motion and neck supple.  Cardiovascular:     Rate and Rhythm: Normal rate.  Pulmonary:     Effort: Pulmonary effort is normal.  Abdominal:     Palpations: Abdomen is soft.     Tenderness: There is abdominal tenderness. There is guarding. There is no rebound.     Comments: Tender suprapubic abdomen and LLQ with fullness LLQ  Genitourinary:    General: Normal vulva.     Vagina: Vaginal discharge present.     Comments: Cervical os shows small amount of white pus with small amount of blood.  Not friable. Positive cervical motion tenderness No adnexal or uterine enlargement Tender UTX Musculoskeletal: Normal  range of motion.  Skin:    General: Skin is warm and dry.  Neurological:     General: No focal deficit present.     Mental Status: She is alert and oriented to person, place, and time.  Psychiatric:        Mood and Affect: Mood normal.        Behavior: Behavior normal.        Thought Content: Thought content normal.        Judgment: Judgment normal.      UC Treatments / Results  Labs (all labs ordered are listed, but only abnormal results are displayed) Labs Reviewed  POCT URINALYSIS DIP (DEVICE) - Abnormal; Notable for the following components:      Result Value   Hgb urine dipstick MODERATE (*)    Leukocytes,Ua TRACE (*)    All other components within normal limits  POC URINE PREG, ED  POCT PREGNANCY, URINE  CERVICOVAGINAL ANCILLARY ONLY    EKG   Radiology No results found.  Procedures Procedures (including critical care time)  Medications Ordered in UC Medications  cefTRIAXone (ROCEPHIN) injection 250 mg (has no administration in time range)  azithromycin (ZITHROMAX) tablet 1,000 mg (has no administration in time range)    Initial Impression / Assessment and Plan / UC Course  I have reviewed the triage vital signs and the nursing notes.  Pertinent labs & imaging results that were available during my care of the patient were reviewed by me and considered in my medical decision making (see chart for details).    Final Clinical Impressions(s) / UC Diagnoses   Final diagnoses:  PID (acute pelvic inflammatory disease)     Discharge Instructions     Your symptoms should resolve by Sunday.    If you continue to have discomfort and discharge, I want you to return Sunday.    ED Prescriptions    Medication Sig Dispense Auth. Provider   ciprofloxacin (CIPRO) 500 MG tablet Take 1 tablet (500 mg total)  by mouth 2 (two) times daily. 14 tablet Elvina Sidle, MD   metroNIDAZOLE (FLAGYL) 500 MG tablet Take 1 tablet (500 mg total) by mouth 2 (two) times daily.  14 tablet Elvina Sidle, MD     I have reviewed the PDMP during this encounter.   Elvina Sidle, MD 12/29/18 [phone]

## 2018-12-29 NOTE — Discharge Instructions (Addendum)
Your symptoms should resolve by Sunday.    If you continue to have discomfort and discharge, I want you to return Sunday.

## 2018-12-29 NOTE — ED Triage Notes (Signed)
Pt states having swollen right eye and blurred vision in her right eye.   Pt wants a STD test, as she is having burning sensation when urinating and vaginal discharge x 2 days.

## 2019-01-03 ENCOUNTER — Telehealth (HOSPITAL_COMMUNITY): Payer: Self-pay | Admitting: Emergency Medicine

## 2019-01-03 ENCOUNTER — Emergency Department (HOSPITAL_COMMUNITY)
Admission: EM | Admit: 2019-01-03 | Discharge: 2019-01-04 | Disposition: A | Discharge status: Home / Self Care | Payer: 59 | Attending: Emergency Medicine | Admitting: Emergency Medicine

## 2019-01-03 ENCOUNTER — Encounter (HOSPITAL_COMMUNITY): Payer: Self-pay | Admitting: Emergency Medicine

## 2019-01-03 ENCOUNTER — Other Ambulatory Visit: Payer: Self-pay

## 2019-01-03 DIAGNOSIS — H5713 Ocular pain, bilateral: Secondary | ICD-10-CM | POA: Diagnosis present

## 2019-01-03 DIAGNOSIS — Z87891 Personal history of nicotine dependence: Secondary | ICD-10-CM | POA: Diagnosis not present

## 2019-01-03 DIAGNOSIS — H109 Unspecified conjunctivitis: Secondary | ICD-10-CM

## 2019-01-03 DIAGNOSIS — R21 Rash and other nonspecific skin eruption: Secondary | ICD-10-CM | POA: Diagnosis not present

## 2019-01-03 LAB — CERVICOVAGINAL ANCILLARY ONLY
Bacterial vaginitis: NEGATIVE
Candida vaginitis: POSITIVE — AB
Chlamydia: POSITIVE — AB
Neisseria Gonorrhea: POSITIVE — AB
Trichomonas: NEGATIVE

## 2019-01-03 MED ORDER — TERCONAZOLE 0.4 % VA CREA: 1.0000 | TOPICAL_CREAM | Freq: Every day | VAGINAL | 0 refills | Status: AC

## 2019-01-03 NOTE — ED Triage Notes (Signed)
Pt seen on 25th at [facility] for swollen red right eye with blurred vision, pt given abx but states eye has not improved and left started being red yesterday. Reports "dark eye circles".

## 2019-01-03 NOTE — Telephone Encounter (Signed)
Test for candida (yeast) was positive.  Prescription for terconazole sent to the pharmacy of record.  Recheck or followup with PCP for further evaluation if symptoms are not improving.    Chlamydia is positive.  This was treated at the urgent care visit with po zithromax 1g.  Pt needs education to please refrain from sexual intercourse for 7 days to give the medicine time to work.  Sexual partners need to be notified and tested/treated.  Condoms may reduce risk of reinfection.  Recheck or followup with PCP for further evaluation if symptoms are not improving.  [facility] notified.  Test for gonorrhea was positive. This was treated at the urgent care visit with IM rocephin 250mg  and po zithromax 1g. Pt needs education to refrain from sexual intercourse for 7 days after treatment to give the medicine time to work. Sexual partners need to be notified and tested/treated. Condoms may reduce risk of reinfection. Recheck or followup with PCP for further evaluation if symptoms are not improving. [facility] notified.   Patient contacted by phone and made aware of    results. Pt verbalized understanding and had all questions answered.

## 2019-01-04 MED ORDER — POLYMYXIN B-TRIMETHOPRIM 10000-0.1 UNIT/ML-% OP SOLN
1.0000 [drp] | OPHTHALMIC | Status: DC
  Administered 2019-01-04: 1 [drp] via OPHTHALMIC
  Filled 2019-01-04: qty 10

## 2019-01-04 MED ORDER — DEXAMETHASONE SODIUM PHOSPHATE 10 MG/ML IJ SOLN
10.0000 mg | Freq: Once | INTRAMUSCULAR | Status: AC
  Administered 2019-01-04: 10 mg via INTRAMUSCULAR
  Filled 2019-01-04: qty 1

## 2019-01-04 NOTE — Discharge Instructions (Addendum)
Please apply the antibiotic eyedrops every 4 hours that you are awake.  Please instill 1 drop in each eye.  You were given a steroid shot in the emergency department.  This should treat the skin rash.  If you have new or worsening symptoms please follow-up with the specialists listed.

## 2019-01-04 NOTE — ED Provider Notes (Signed)
[place] EMERGENCY DEPARTMENT Provider Note   CSN: [id number] Arrival date & time: 01/03/19  2241     History   Chief Complaint No chief complaint on file.   HPI Sandra Morris is a 23 y.o. female.     Patient presents to the emergency department with a chief complaint of eye irritation.  She states that 3 days ago she noticed redness in her right eye.  It has been watery.  She has noticed crustiness in the morning.  She states that this is now spread to her left eye.  She is concerned about pinkeye.  She has not tried any treatments prior to arrival.  Patient also reports having an allergic reaction to a lotion about a week ago.  She states that she believes it flared up her eczema.  She is also concerned about having dark circles under her eyes.  She noticed these about the same time.  She denies any fevers.  Denies any new medications or allergic contacts except for the lotion as stated above.  The history is provided by the patient. No language interpreter was used.    Past Medical History:  Diagnosis Date  . Asthma   . Chlamydia   . Gonorrhea     There are no active problems to display for this patient.   Past Surgical History:  Procedure Laterality Date  . WISDOM TOOTH EXTRACTION       OB History    Gravida  1   Para      Term      Preterm      AB      Living        SAB      TAB      Ectopic      Multiple      Live Births               Home Medications    Prior to Admission medications   Medication Sig Start Date End Date Taking? Authorizing Provider  ciprofloxacin (CIPRO) 500 MG tablet Take 1 tablet (500 mg total) by mouth 2 (two) times daily. 12/29/18   Robyn Haber, MD  fluticasone (FLONASE) 50 MCG/ACT nasal spray Place 1 spray into both nostrils daily. 10/20/18   Sharion Balloon, NP  meloxicam (MOBIC) 7.5 MG tablet Take 1 tablet (7.5 mg total) by mouth daily. 12/07/18   Jaynee Eagles, PA-C  metroNIDAZOLE  (FLAGYL) 500 MG tablet Take 1 tablet (500 mg total) by mouth 2 (two) times daily. 12/29/18   Robyn Haber, MD  terconazole (TERAZOL 7) 0.4 % vaginal cream Place 1 applicator vaginally at bedtime for 7 days. 01/03/19 01/10/19  Raylene Everts, MD    Family History Family History  Problem Relation Age of Onset  . Healthy Mother   . Hypertension Father     Social History Social History   Tobacco Use  . Smoking status: Former Smoker    Packs/day: 0.25    Years: 3.00    Pack years: 0.75  . Smokeless tobacco: Never Used  Substance Use Topics  . Alcohol use: Yes    Comment: occasional  . Drug use: No     Allergies   Patient has no known allergies.   Review of Systems Review of Systems  All other systems reviewed and are negative.    Physical Exam Updated Vital Signs BP 127/87 (BP Location: Left Arm)   Pulse (!) 104   Temp 97.9 F (36.6  C) (Oral)   Resp 16   SpO2 94%   Physical Exam Vitals signs and nursing note reviewed.  Constitutional:      General: She is not in acute distress.    Appearance: She is well-developed.  HENT:     Head: Normocephalic and atraumatic.  Eyes:     Comments: Mild conjunctival erythema, no discharge  Neck:     Musculoskeletal: Neck supple.  Cardiovascular:     Rate and Rhythm: Normal rate and regular rhythm.     Heart sounds: No murmur.  Pulmonary:     Effort: Pulmonary effort is normal. No respiratory distress.     Breath sounds: Normal breath sounds.  Abdominal:     Palpations: Abdomen is soft.     Tenderness: There is no abdominal tenderness.  Skin:    General: Skin is warm and dry.     Comments: Eczema to left antecubital fossa, maculopapular rash to left forearm, and chest wall  No evidence of cellulitis or abscess  Neurological:     Mental Status: She is alert.      ED Treatments / Results  Labs (all labs ordered are listed, but only abnormal results are displayed) Labs Reviewed - No data to display  EKG  None  Radiology No results found.  Procedures Procedures (including critical care time)  Medications Ordered in ED Medications  dexamethasone (DECADRON) injection 10 mg (has no administration in time range)  trimethoprim-polymyxin b (POLYTRIM) ophthalmic solution 1 drop (has no administration in time range)     Initial Impression / Assessment and Plan / ED Course  I have reviewed the triage vital signs and the nursing notes.  Pertinent labs & imaging results that were available during my care of the patient were reviewed by me and considered in my medical decision making (see chart for details).       Patient with bilateral conjunctivitis, likely viral.  She also has an eczema flare, and possibly an allergic reaction to lotion.  She did try taking some Benadryl about a week ago, but quit because it made her drowsy.  We will give her a shot of Decadron.  Recommend close follow-up with dermatology and ophthalmology if symptoms persist.  Final Clinical Impressions(s) / ED Diagnoses   Final diagnoses:  Conjunctivitis of both eyes, unspecified conjunctivitis type  Skin rash    ED Discharge Orders    None       Roxy Horseman, PA-C 01/04/19 0216    Mesner, Barbara Cower, MD 01/04/19 0422

## 2019-01-26 IMAGING — US US ABDOMEN LIMITED
1 series · 14 of 25 positions shown · non-contrast
Comparison: None.

CLINICAL DATA: Right upper quadrant pain.

EXAM:
ULTRASOUND ABDOMEN LIMITED RIGHT UPPER QUADRANT

[Series 1: us abdomen limited · 0.17mm/px · 14 of 40 slices shown]
[im 1/40]
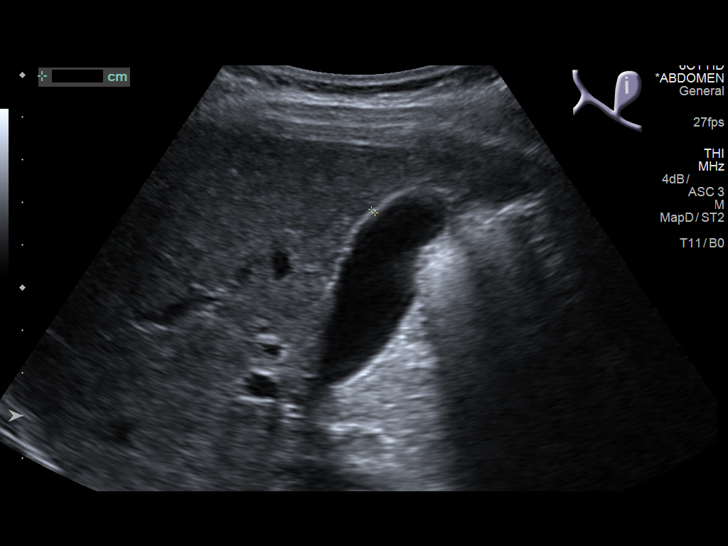
[im 4/40]
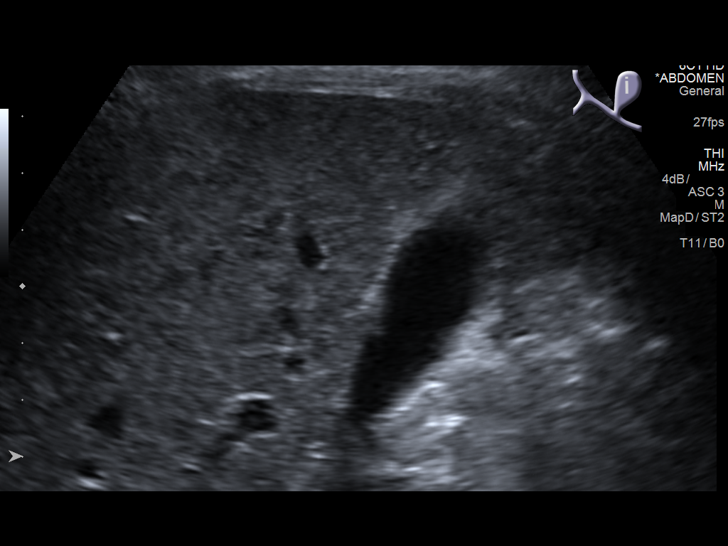
[im 7/40]
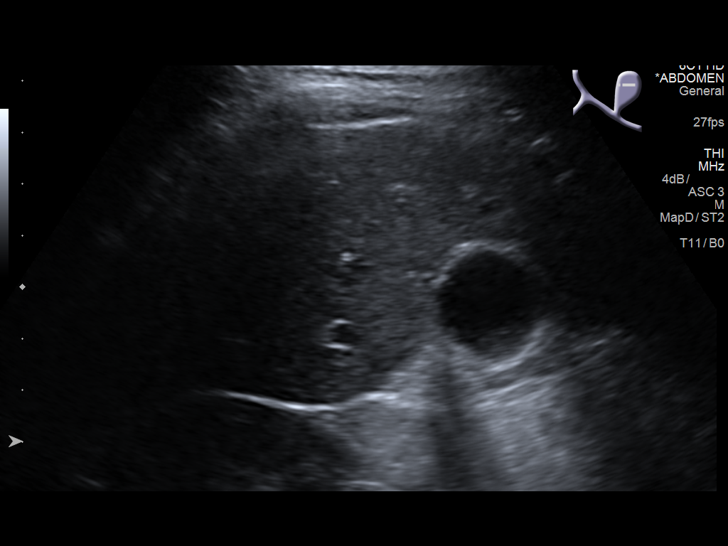
[im 10/40]
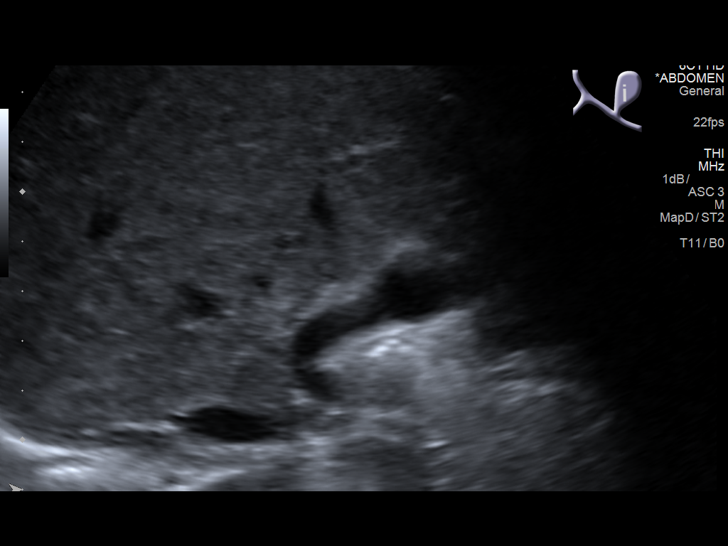
[im 14/40]
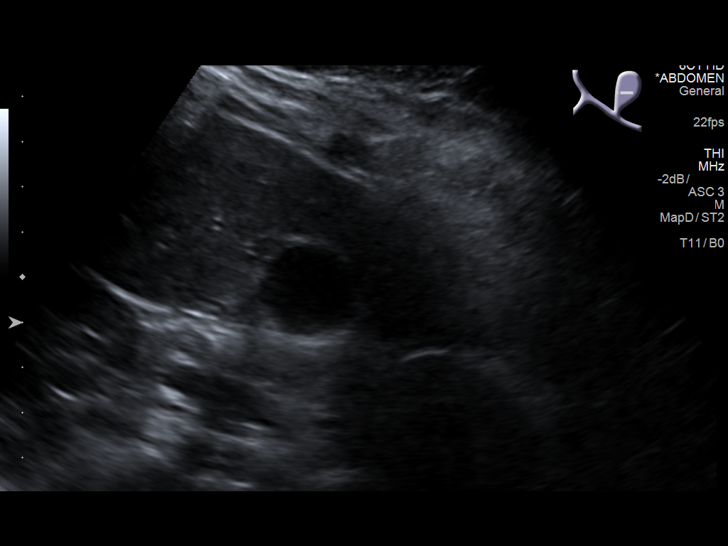
[im 15/40]
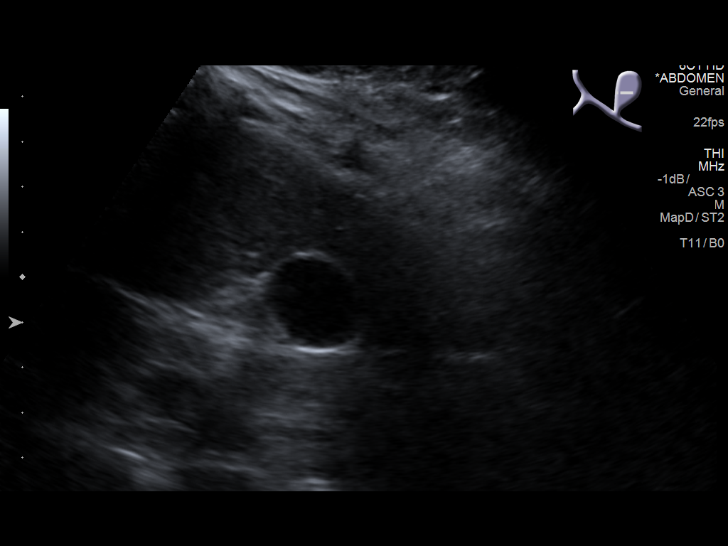
[im 18/40]
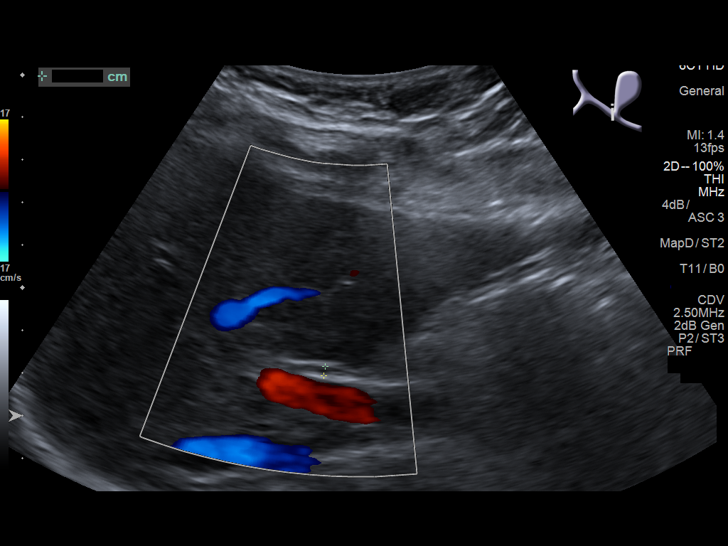
[im 22/40]
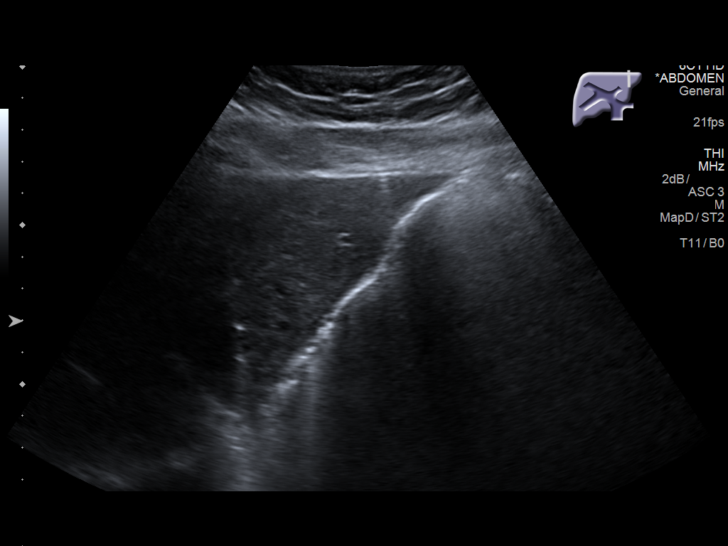
[im 25/40]
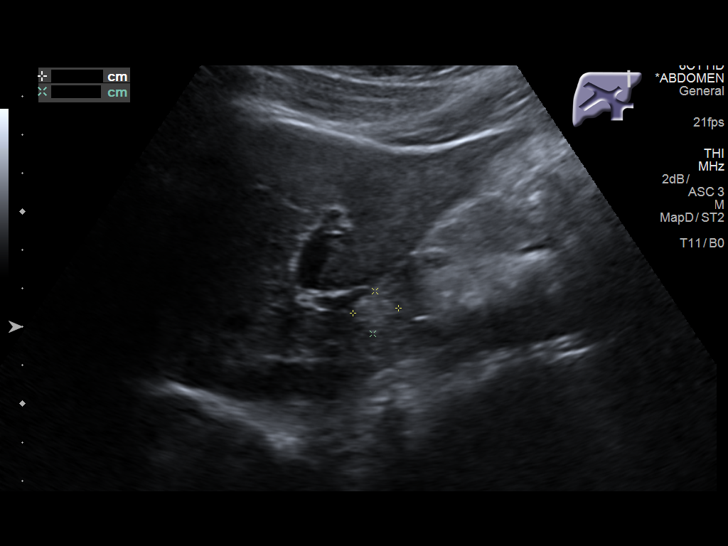
[im 27/40]
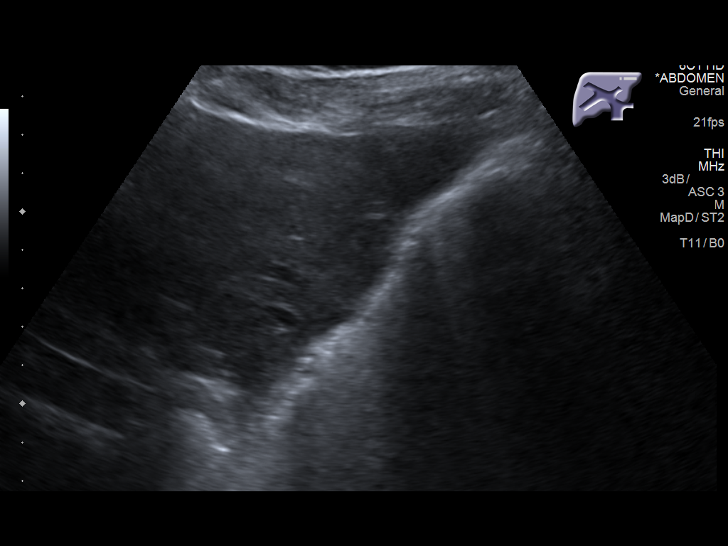
[im 30/40]
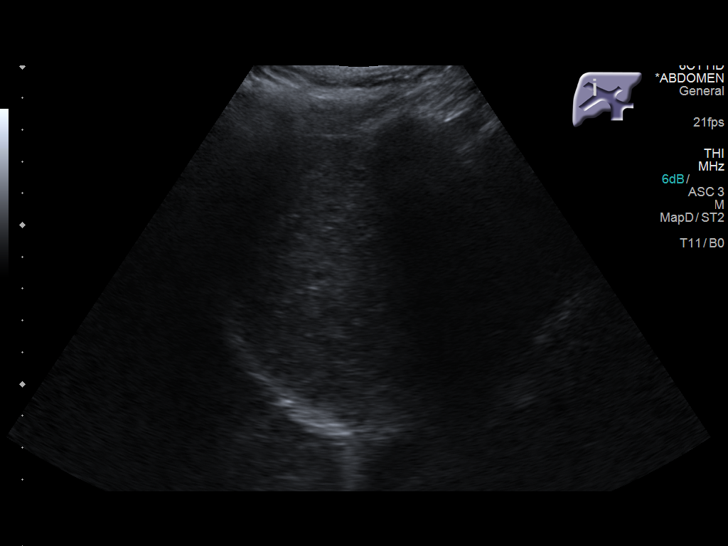
[im 33/40]
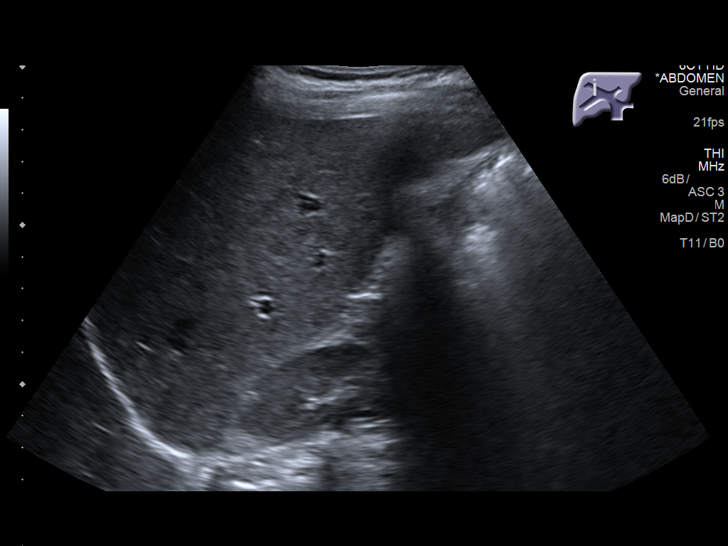
[im 36/40]
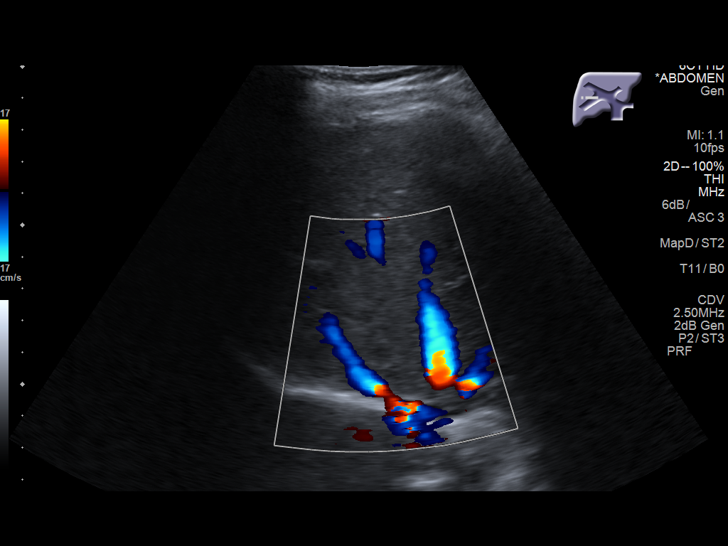
[im 40/40]
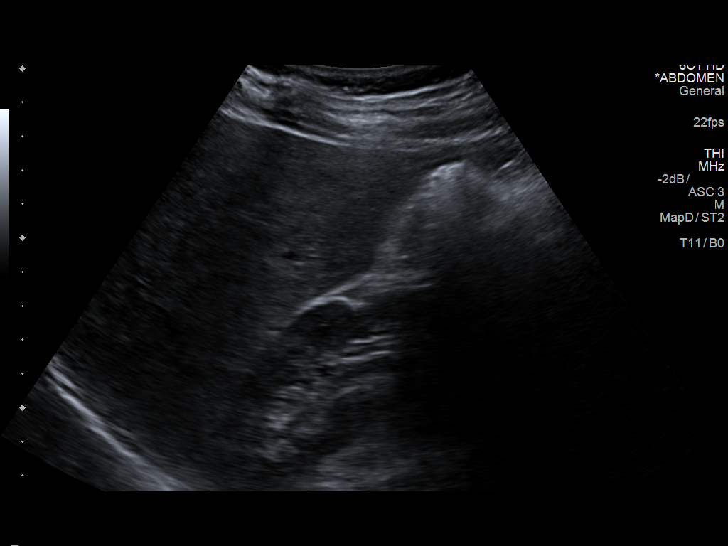

[14 of 25 positions shown; findings below may reference images not displayed]

FINDINGS: Gallbladder:

Physiologically distended. No gallstones or wall thickening
visualized. No sonographic Murphy sign noted by sonographer.

Common bile duct:

Diameter: 2 mm, normal.

Liver:

Echogenic 1.1 x 1.2 x 1.5 cm lesion in the right lobe of the liver.
Within normal limits in parenchymal echogenicity. Portal vein is
patent on color Doppler imaging with normal direction of blood flow
towards the liver.
IMPRESSION: 1. Normal sonographic appearance of the gallbladder and biliary
tree. No gallstones.
2. Echogenic 1.5 cm lesion in the central liver is likely a
hemangioma, but nonspecific. In the absence of malignancy history
recommend follow-up ultrasound in 6 months to confirm stability. If
there is history of malignancy, recommend MRI characterization.

## 2019-02-11 ENCOUNTER — Ambulatory Visit (HOSPITAL_COMMUNITY)
Admission: EM | Admit: 2019-02-11 | Discharge: 2019-02-11 | Disposition: A | Discharge status: Home / Self Care | Payer: 59 | Attending: Family Medicine | Admitting: Family Medicine

## 2019-02-11 ENCOUNTER — Encounter (HOSPITAL_COMMUNITY): Payer: Self-pay

## 2019-02-11 ENCOUNTER — Other Ambulatory Visit: Payer: Self-pay

## 2019-02-11 DIAGNOSIS — U071 COVID-19: Secondary | ICD-10-CM | POA: Diagnosis not present

## 2019-02-11 DIAGNOSIS — R000 Tachycardia, unspecified: Secondary | ICD-10-CM | POA: Diagnosis not present

## 2019-02-11 LAB — POC SARS CORONAVIRUS 2 AG: SARS Coronavirus 2 Ag: POSITIVE — AB

## 2019-02-11 LAB — POC SARS CORONAVIRUS 2 AG -  ED: SARS Coronavirus 2 Ag: POSITIVE — AB

## 2019-02-11 MED ORDER — IBUPROFEN 600 MG PO TABS: 600.0000 mg | ORAL_TABLET | Freq: Four times a day (QID) | ORAL | 0 refills | Status: AC | PRN

## 2019-02-11 MED ORDER — CETIRIZINE HCL 10 MG PO CAPS: 10.0000 mg | ORAL_CAPSULE | Freq: Every day | ORAL | 0 refills | Status: AC

## 2019-02-11 MED ORDER — FLUTICASONE PROPIONATE 50 MCG/ACT NA SUSP: 1.0000 | Freq: Every day | NASAL | 0 refills | Status: AC

## 2019-02-11 NOTE — Discharge Instructions (Signed)
Covid swab positive Quarantine for the next 10 days as well as until symptoms improving and fever free for 24 hours Rest and drink plenty of fluids Alternate Tylenol and ibuprofen every 4 hours to treat any fevers, headaches, body aches Begin daily cetirizine and Flonase nasal spray to help with sinus pressure, congestion and any postnasal drainage  Please follow-up if developing any difficulty breathing, shortness of breath, chest pain

## 2019-02-11 NOTE — ED Triage Notes (Signed)
Patient presents to Urgent Care with complaints of generalized body aches, headache, slight congestion since yesterday. Patient reports she has not lost smell, taste, or appetitie.

## 2019-02-11 NOTE — ED Provider Notes (Signed)
[facility]    CSN: [id number] Arrival date & time: 02/11/19  1011      History   Chief Complaint Chief Complaint  Patient presents with  . Generalized Body Aches    HPI Sandra Morris is a 24 y.o. female presenting today for evaluation of body aches, headaches and URI symptoms.  Patient states that beginning yesterday she began to feel achy have a slight headache and some mild congestion.  Symptoms have worsened today.  She denies any known fevers, denies loss of smell or taste.  Has had very minimal cough at this time.  She does report she had an exposure to Covid on Sunday, approximately 5 days ago.  Denies chest pain or shortness of breath.  HPI  Past Medical History:  Diagnosis Date  . Asthma   . Chlamydia   . Gonorrhea     There are no problems to display for this patient.   Past Surgical History:  Procedure Laterality Date  . WISDOM TOOTH EXTRACTION      OB History    Gravida  1   Para      Term      Preterm      AB      Living        SAB      TAB      Ectopic      Multiple      Live Births               Home Medications    Prior to Admission medications   Medication Sig Start Date End Date Taking? Authorizing Provider  Cetirizine HCl 10 MG CAPS Take 1 capsule (10 mg total) by mouth daily. 02/11/19   Marely Apgar C, PA-C  fluticasone (FLONASE) 50 MCG/ACT nasal spray Place 1-2 sprays into both nostrils daily for 7 days. 02/11/19 02/18/19  Johnye Kist C, PA-C  ibuprofen (ADVIL) 600 MG tablet Take 1 tablet (600 mg total) by mouth every 6 (six) hours as needed. 02/11/19   Nima Bamburg, Junius Creamer, PA-C    Family History Family History  Problem Relation Age of Onset  . Healthy Mother   . Hypertension Father     Social History Social History   Tobacco Use  . Smoking status: Former Smoker    Packs/day: 0.25    Years: 3.00    Pack years: 0.75  . Smokeless tobacco: Never Used  Substance Use Topics  . Alcohol use: Yes   Comment: occasional  . Drug use: No     Allergies   Patient has no known allergies.   Review of Systems Review of Systems  Constitutional: Positive for fatigue. Negative for activity change, appetite change, chills and fever.  HENT: Positive for congestion, rhinorrhea and sinus pressure. Negative for ear pain, sore throat and trouble swallowing.   Eyes: Negative for discharge and redness.  Respiratory: Positive for cough. Negative for chest tightness and shortness of breath.   Cardiovascular: Negative for chest pain.  Gastrointestinal: Negative for abdominal pain, diarrhea, nausea and vomiting.  Musculoskeletal: Positive for myalgias.  Skin: Negative for rash.  Neurological: Positive for headaches. Negative for dizziness and light-headedness.     Physical Exam Triage Vital Signs ED Triage Vitals  Enc Vitals Group     BP 02/11/19 1050 124/81     Pulse Rate 02/11/19 1050 (!) 112     Resp 02/11/19 1050 16     Temp 02/11/19 1050 98.8 F (37.1 C)  Temp Source 02/11/19 1050 Oral     SpO2 02/11/19 1050 100 %     Weight --      Height --      Head Circumference --      Peak Flow --      Pain Score 02/11/19 1048 6     Pain Loc --      Pain Edu? --      Excl. in [place]? --    No data found.  Updated Vital Signs BP 124/81 (BP Location: Right Arm)   Pulse (!) 112   Temp 98.8 F (37.1 C) (Oral)   Resp 16   SpO2 100%   Visual Acuity Right Eye Distance:   Left Eye Distance:   Bilateral Distance:    Right Eye Near:   Left Eye Near:    Bilateral Near:     Physical Exam Vitals and nursing note reviewed.  Constitutional:      General: She is not in acute distress.    Appearance: She is well-developed.  HENT:     Head: Normocephalic and atraumatic.     Ears:     Comments: Bilateral ears without tenderness to palpation of external auricle, tragus and mastoid, EAC's without erythema or swelling, TM's with good bony landmarks and cone of light. Non erythematous.     Mouth/Throat:     Comments: Oral mucosa pink and moist, no tonsillar enlargement or exudate. Posterior pharynx patent and nonerythematous, no uvula deviation or swelling. Normal phonation. Eyes:     Conjunctiva/sclera: Conjunctivae normal.  Cardiovascular:     Rate and Rhythm: Regular rhythm. Tachycardia present.     Heart sounds: No murmur.  Pulmonary:     Effort: Pulmonary effort is normal. No respiratory distress.     Breath sounds: Normal breath sounds.     Comments: Breathing comfortably at rest, CTABL, no wheezing, rales or other adventitious sounds auscultated Abdominal:     Palpations: Abdomen is soft.     Tenderness: There is no abdominal tenderness.  Musculoskeletal:     Cervical back: Neck supple.  Skin:    General: Skin is warm and dry.  Neurological:     Mental Status: She is alert.      UC Treatments / Results  Labs (all labs ordered are listed, but only abnormal results are displayed) Labs Reviewed  POC SARS CORONAVIRUS 2 AG -  ED - Abnormal; Notable for the following components:      Result Value   SARS Coronavirus 2 Ag POSITIVE (*)    All other components within normal limits  POC SARS CORONAVIRUS 2 AG - Abnormal; Notable for the following components:   SARS Coronavirus 2 Ag POSITIVE (*)    All other components within normal limits    EKG   Radiology No results found.  Procedures Procedures (including critical care time)  Medications Ordered in UC Medications - No data to display  Initial Impression / Assessment and Plan / UC Course  I have reviewed the triage vital signs and the nursing notes.  Pertinent labs & imaging results that were available during my care of the patient were reviewed by me and considered in my medical decision making (see chart for details).     Rapid Covid positive.  Discussed quarantine recommendations.  Symptomatic and supportive care.  Lungs clear and no respiratory distress at this time.  Rest and push  fluids.Discussed strict return precautions. Patient verbalized understanding and is agreeable with plan.  Final Clinical Impressions(s) /  UC Diagnoses   Final diagnoses:  COVID-19 virus detected     Discharge Instructions     Covid swab positive Quarantine for the next 10 days as well as until symptoms improving and fever free for 24 hours Rest and drink plenty of fluids Alternate Tylenol and ibuprofen every 4 hours to treat any fevers, headaches, body aches Begin daily cetirizine and Flonase nasal spray to help with sinus pressure, congestion and any postnasal drainage  Please follow-up if developing any difficulty breathing, shortness of breath, chest pain    ED Prescriptions    Medication Sig Dispense Auth. Provider   Cetirizine HCl 10 MG CAPS Take 1 capsule (10 mg total) by mouth daily. 15 capsule Denee Boeder C, PA-C   fluticasone (FLONASE) 50 MCG/ACT nasal spray Place 1-2 sprays into both nostrils daily for 7 days. 1 g Halona Amstutz C, PA-C   ibuprofen (ADVIL) 600 MG tablet Take 1 tablet (600 mg total) by mouth every 6 (six) hours as needed. 30 tablet Daris Harkins, [place] C, PA-C     PDMP not reviewed this encounter.   Lew Dawes, [place] 02/11/19 1122

## 2019-05-08 IMAGING — DX DG ABDOMEN 2V
2 series · 2 of 2 positions shown · non-contrast
Comparison: 12/30/2017

CLINICAL DATA: Abdominal pain

EXAM:
ABDOMEN - 2 VIEW

[dg abd 2 views (1 of 2)]
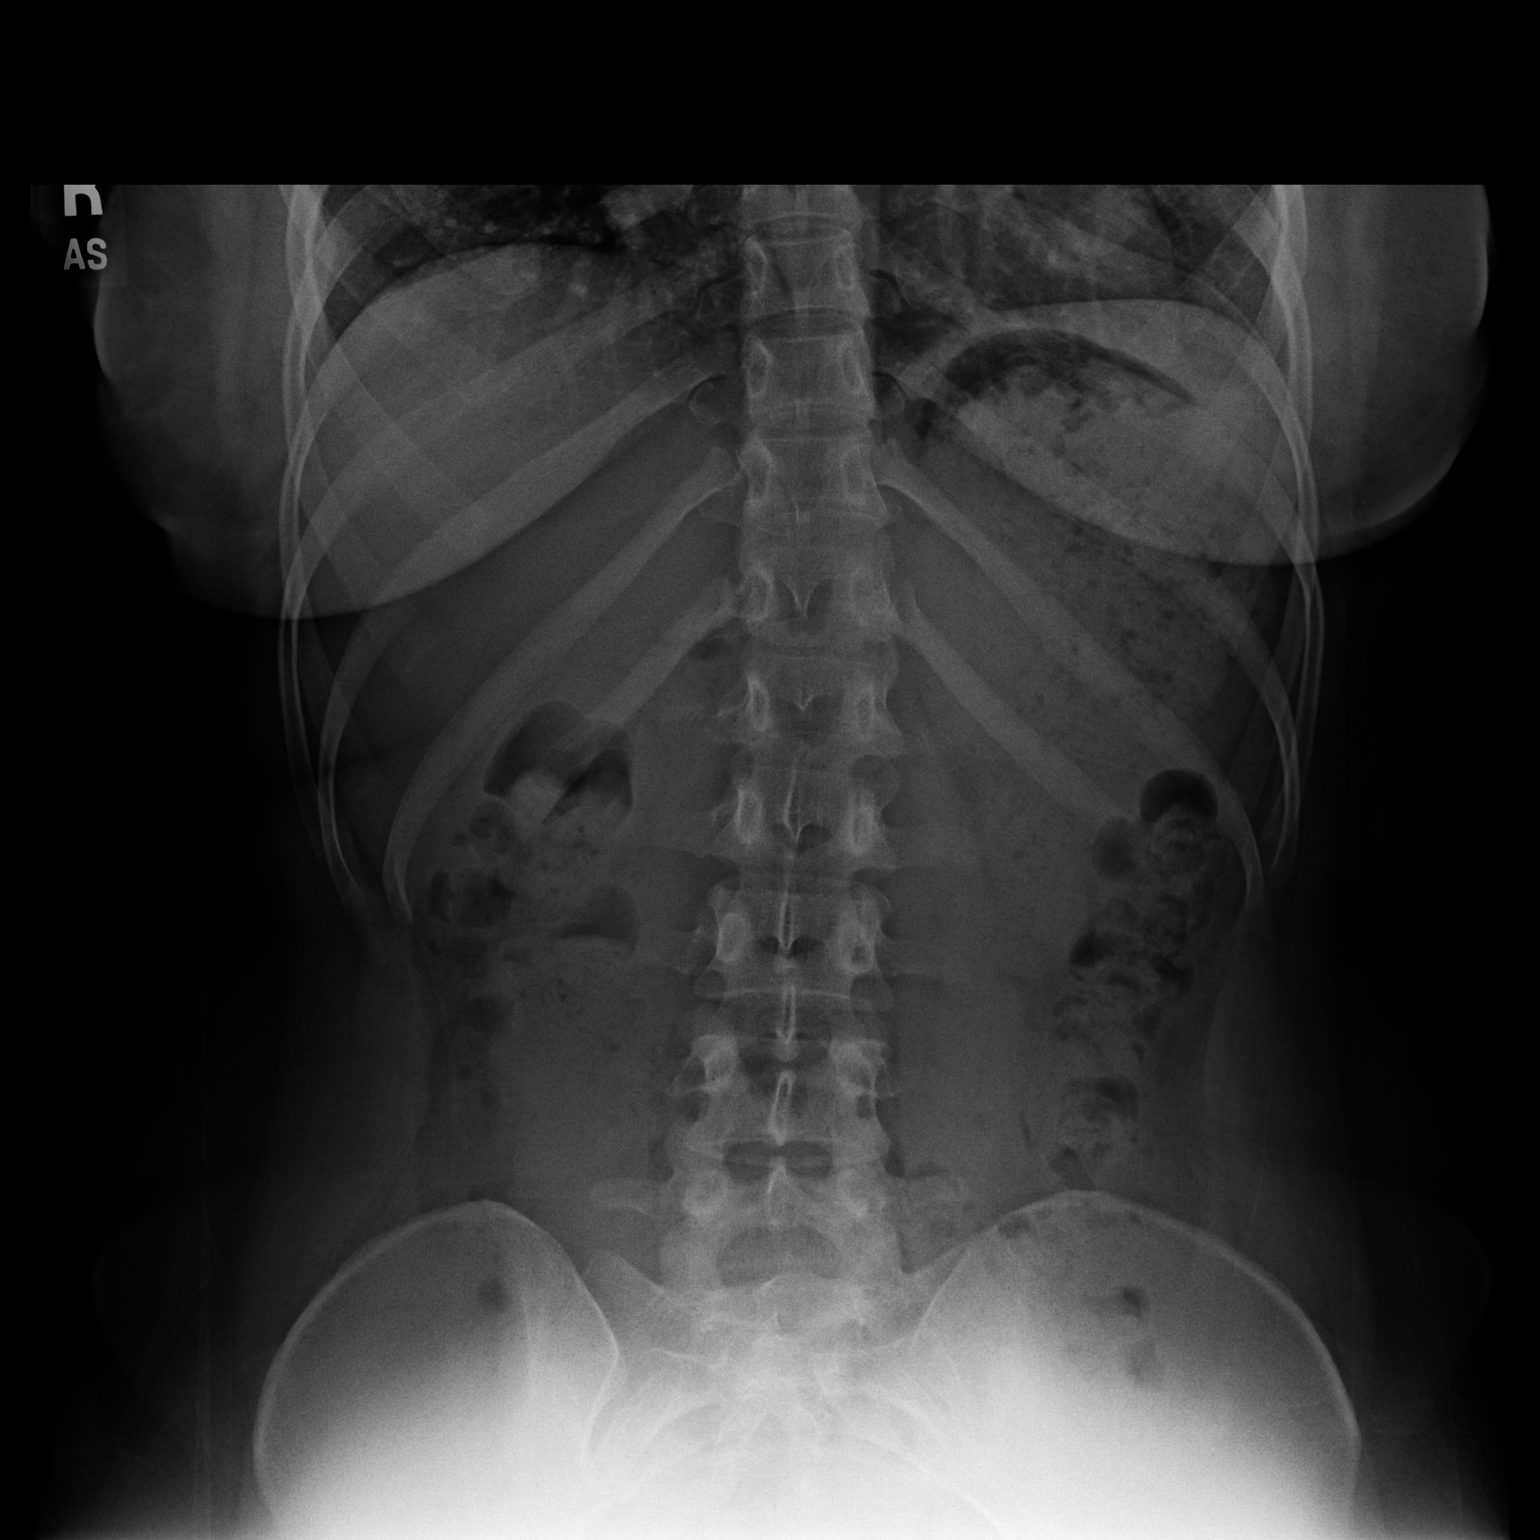

[dg abd 2 views (2 of 2)]
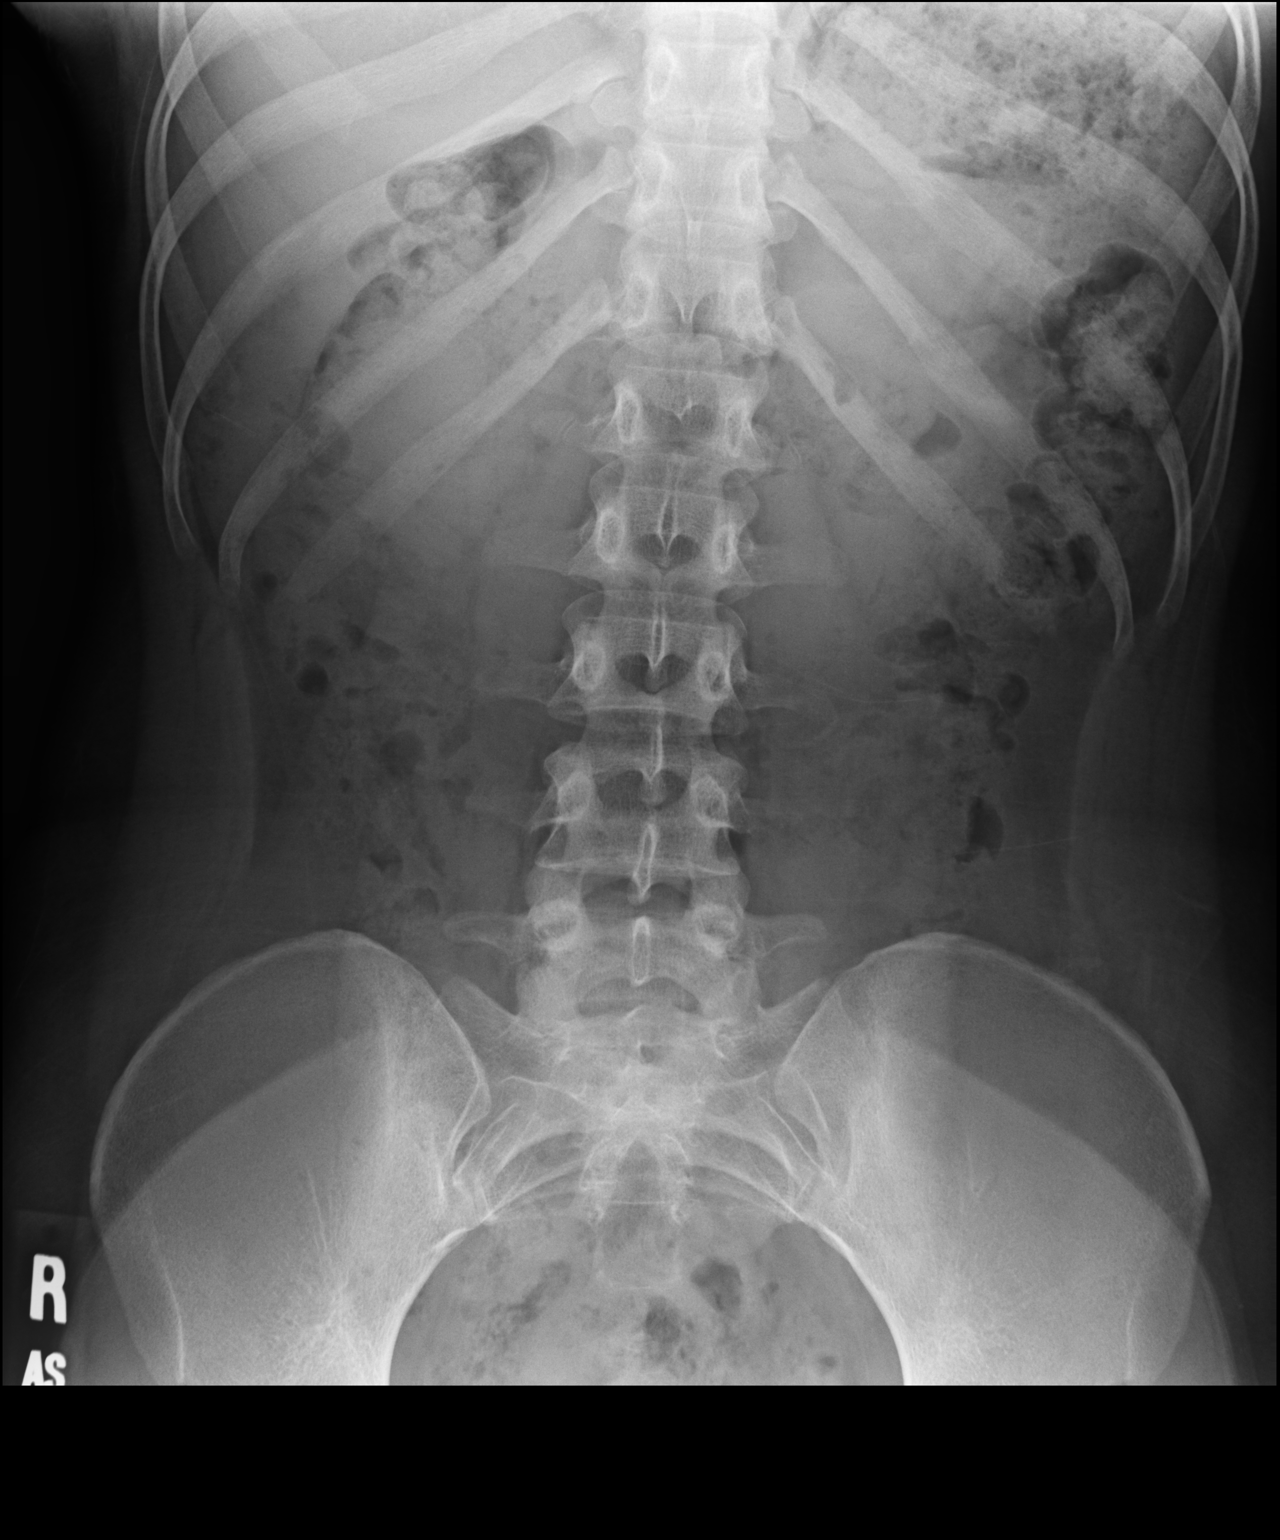

[2 of 2 positions shown; findings below may reference images not displayed]

FINDINGS: Scattered large and small bowel gas is noted. Fecal material is
noted throughout the colon consistent with a degree of constipation.
Stomach is distended with food stuffs. No mass or abnormal
calcifications are noted. No acute bony abnormality is seen.
IMPRESSION: Changes consistent with constipation.

## 2019-09-03 ENCOUNTER — Inpatient Hospital Stay (HOSPITAL_COMMUNITY)
Admission: AD | Admit: 2019-09-03 | Discharge: 2019-09-03 | Disposition: A | Discharge status: Home / Self Care | Payer: 59 | Attending: Obstetrics & Gynecology | Admitting: Obstetrics & Gynecology

## 2019-09-03 ENCOUNTER — Other Ambulatory Visit: Payer: Self-pay

## 2019-09-03 DIAGNOSIS — R109 Unspecified abdominal pain: Secondary | ICD-10-CM

## 2019-09-03 DIAGNOSIS — Z3202 Encounter for pregnancy test, result negative: Secondary | ICD-10-CM

## 2019-09-03 LAB — POCT PREGNANCY, URINE: Preg Test, Ur: NEGATIVE

## 2019-09-03 LAB — HCG, QUANTITATIVE, PREGNANCY: hCG, Beta Chain, Quant, S: <1 m[IU]/mL (ref <5)

## 2019-09-03 NOTE — Discharge Instructions (Signed)

## 2019-09-03 NOTE — MAU Provider Note (Signed)
  S Ms. Sandra Morris is a 24 y.o. G1P0 non-pregnant female who presents to MAU today with complaint of possible pregnancy and abdominal pain. She reports a faint positive test at home.   O BP (!) 121/63 (BP Location: Right Arm)   Pulse 92   Temp 98.2 F (36.8 C) (Oral)   Resp 16   LMP 08/08/2019   SpO2 99% Comment: room air Physical Exam Vitals and nursing note reviewed.  Constitutional:      General: She is not in acute distress.    Appearance: She is well-developed.  HENT:     Head: Normocephalic.  Eyes:     Pupils: Pupils are equal, round, and reactive to light.  Cardiovascular:     Rate and Rhythm: Normal rate and regular rhythm.     Heart sounds: Normal heart sounds.  Pulmonary:     Effort: Pulmonary effort is normal. No respiratory distress.     Breath sounds: Normal breath sounds.  Abdominal:     General: Bowel sounds are normal. There is no distension.     Palpations: Abdomen is soft.     Tenderness: There is no abdominal tenderness.  Skin:    General: Skin is warm and dry.  Neurological:     Mental Status: She is alert and oriented to person, place, and time.  Psychiatric:        Behavior: Behavior normal.        Thought Content: Thought content normal.        Judgment: Judgment normal.    Results for orders placed or performed during the hospital encounter of 09/03/19 (from the past 24 hour(s))  Pregnancy, urine POC     Status: None   Collection Time: 09/03/19 12:24 PM  Result Value Ref Range   Preg Test, Ur NEGATIVE NEGATIVE  hCG, quantitative, pregnancy     Status: None   Collection Time: 09/03/19 12:57 PM  Result Value Ref Range   hCG, Beta Chain, Quant, S <1 <5 mIU/mL   A Non pregnant female Medical screening exam complete 1. Negative pregnancy test     P Discharge from MAU in stable condition Patient given the option of transfer to [facility] for further evaluation or seek care in outpatient facility of choice List of options for follow-up given   Warning signs for worsening condition that would warrant emergency follow-up discussed Patient may return to MAU as needed for pregnancy related complaints  Rolm Bookbinder, CNM 09/03/2019 2:06 PM

## 2019-09-03 NOTE — MAU Note (Signed)
Pt providing urine sample.

## 2019-09-03 NOTE — MAU Note (Signed)
Sandra Morris is a 23 y.o. here in MAU reporting: cramping and nausea. Cramping has been going on for 4 days and nausea started after. Had a little bit of bleeding today when using the bathroom. Had faint positive at home 1-2 days ago.  LMP: 08/08/19  Onset of complaint: ongoing  Pain score: 4/10  Vitals:   09/03/19 1231  BP: (!) 121/63  Pulse: 92  Resp: 16  Temp: 98.2 F (36.8 C)  SpO2: 99%     Lab orders placed from triage: UPT

## 2019-09-20 ENCOUNTER — Other Ambulatory Visit: Payer: Self-pay

## 2019-09-20 ENCOUNTER — Emergency Department (HOSPITAL_COMMUNITY)
Admission: EM | Admit: 2019-09-20 | Discharge: 2019-09-21 | Disposition: A | Discharge status: Home / Self Care | Payer: 59 | Attending: Emergency Medicine | Admitting: Emergency Medicine

## 2019-09-20 ENCOUNTER — Encounter (HOSPITAL_COMMUNITY): Payer: Self-pay

## 2019-09-20 ENCOUNTER — Ambulatory Visit (INDEPENDENT_AMBULATORY_CARE_PROVIDER_SITE_OTHER)
Admission: EM | Admit: 2019-09-20 | Discharge: 2019-09-20 | Disposition: A | Discharge status: Nursing Home (Includes ICF) | Payer: 59 | Source: Home / Self Care

## 2019-09-20 DIAGNOSIS — Z8616 Personal history of COVID-19: Secondary | ICD-10-CM | POA: Insufficient documentation

## 2019-09-20 DIAGNOSIS — R42 Dizziness and giddiness: Secondary | ICD-10-CM

## 2019-09-20 DIAGNOSIS — Z79899 Other long term (current) drug therapy: Secondary | ICD-10-CM | POA: Insufficient documentation

## 2019-09-20 DIAGNOSIS — R519 Headache, unspecified: Secondary | ICD-10-CM | POA: Insufficient documentation

## 2019-09-20 DIAGNOSIS — Z5321 Procedure and treatment not carried out due to patient leaving prior to being seen by health care provider: Secondary | ICD-10-CM | POA: Diagnosis not present

## 2019-09-20 DIAGNOSIS — M6281 Muscle weakness (generalized): Secondary | ICD-10-CM | POA: Insufficient documentation

## 2019-09-20 DIAGNOSIS — R109 Unspecified abdominal pain: Secondary | ICD-10-CM | POA: Insufficient documentation

## 2019-09-20 DIAGNOSIS — R000 Tachycardia, unspecified: Secondary | ICD-10-CM

## 2019-09-20 DIAGNOSIS — R1084 Generalized abdominal pain: Secondary | ICD-10-CM

## 2019-09-20 DIAGNOSIS — Z87891 Personal history of nicotine dependence: Secondary | ICD-10-CM | POA: Insufficient documentation

## 2019-09-20 DIAGNOSIS — Z20822 Contact with and (suspected) exposure to covid-19: Secondary | ICD-10-CM | POA: Insufficient documentation

## 2019-09-20 DIAGNOSIS — R509 Fever, unspecified: Secondary | ICD-10-CM | POA: Insufficient documentation

## 2019-09-20 DIAGNOSIS — R0789 Other chest pain: Secondary | ICD-10-CM | POA: Insufficient documentation

## 2019-09-20 LAB — CBC
HCT: 47.9 % — ABNORMAL HIGH (ref 36.0–46.0)
Hemoglobin: 15.4 g/dL — ABNORMAL HIGH (ref 12.0–15.0)
MCH: 26.3 pg (ref 26.0–34.0)
MCHC: 32.2 g/dL (ref 30.0–36.0)
MCV: 81.9 fL (ref 80.0–100.0)
Platelets: 339 10*3/uL (ref 150–400)
RBC: 5.85 MIL/uL — ABNORMAL HIGH (ref 3.87–5.11)
RDW: 13.8 % (ref 11.5–15.5)
WBC: 14.1 10*3/uL — ABNORMAL HIGH (ref 4.0–10.5)
nRBC: 0 % (ref 0.0–0.2)

## 2019-09-20 LAB — LIPASE, BLOOD: Lipase: 20 U/L (ref 11–51)

## 2019-09-20 LAB — COMPREHENSIVE METABOLIC PANEL
ALT: 19 U/L (ref 0–44)
AST: 23 U/L (ref 15–41)
Albumin: 3.7 g/dL (ref 3.5–5.0)
Alkaline Phosphatase: 167 U/L — ABNORMAL HIGH (ref 38–126)
Anion gap: 10 (ref 5–15)
BUN: 10 mg/dL (ref 6–20)
CO2: 21 mmol/L — ABNORMAL LOW (ref 22–32)
Calcium: 9.3 mg/dL (ref 8.9–10.3)
Chloride: 104 mmol/L (ref 98–111)
Creatinine, Ser: 0.54 mg/dL (ref 0.44–1.00)
GFR calc Af Amer: >60 mL/min (ref >60)
GFR calc non Af Amer: >60 mL/min (ref >60)
Glucose, Bld: 79 mg/dL (ref 70–99)
Potassium: 3.9 mmol/L (ref 3.5–5.1)
Sodium: 135 mmol/L (ref 135–145)
Total Bilirubin: 1.1 mg/dL (ref 0.3–1.2)
Total Protein: 7.1 g/dL (ref 6.5–8.1)

## 2019-09-20 LAB — I-STAT BETA HCG BLOOD, ED (MC, WL, AP ONLY): I-stat hCG, quantitative: <5 m[IU]/mL (ref <5)

## 2019-09-20 LAB — URINALYSIS, ROUTINE W REFLEX MICROSCOPIC
Bilirubin Urine: NEGATIVE
Glucose, UA: NEGATIVE mg/dL
Hgb urine dipstick: NEGATIVE
Ketones, ur: 80 mg/dL — AB
Leukocytes,Ua: NEGATIVE
Nitrite: NEGATIVE
Protein, ur: NEGATIVE mg/dL
Specific Gravity, Urine: 1.021 (ref 1.005–1.030)
pH: 5 (ref 5.0–8.0)

## 2019-09-20 MED ORDER — ACETAMINOPHEN 500 MG PO TABS
1000.0000 mg | ORAL_TABLET | Freq: Once | ORAL | Status: DC
  Filled 2019-09-20: qty 2

## 2019-09-20 MED ORDER — SODIUM CHLORIDE 0.9 % IV BOLUS
1000.0000 mL | Freq: Once | INTRAVENOUS | Status: AC
  Administered 2019-09-20: 1000 mL via INTRAVENOUS

## 2019-09-20 NOTE — ED Triage Notes (Addendum)
Pt c/o general body aches, HA, nausea, abdominal pain onset this morning. No OTC meds taken for symptoms. Denies fever, chills, congestion, cough, runny nose or sore throat, v/d.  Returned from [place] approx 2 weeks ago.

## 2019-09-20 NOTE — ED Provider Notes (Signed)
[facility]    CSN: [id number] Arrival date & time: 09/20/19  1801      History   Chief Complaint Chief Complaint  Patient presents with  . Headache    HPI Sandra Morris is a 24 y.o. female.   Patient presents for cute onset of headache, body ache, abdominal pain, nausea and fever.  She reports all symptoms started this morning abruptly and have been severe.  She reports she also hit her head on a door on the way here.  Reports that head hurts all over but is worse in the front.  She reports sensitivity to light.  She reports she has been nauseous but has not vomited.  She reports belly pain and points to her middle abdomen.  She reports she has not moved her bowels today but has not had diarrhea recently.  She reports no painful urination, frequency or urgency.  She denies cough, sore throat or runny nose.  She does report some upper chest pain and feeling short of breath.  Has not been around by sick.  She reports she had Covid and verified by chart review in January 2021.  She does report she returned from [place] on 08/31/2019.  She reports otherwise she has been feeling well up until this morning.  Last menstrual period was the end of July.  Denies any vaginal discharge or bleeding.     Past Medical History:  Diagnosis Date  . Asthma   . Chlamydia   . Gonorrhea     There are no problems to display for this patient.   Past Surgical History:  Procedure Laterality Date  . WISDOM TOOTH EXTRACTION      OB History    Gravida  1   Para      Term      Preterm      AB      Living        SAB      TAB      Ectopic      Multiple      Live Births               Home Medications    Prior to Admission medications   Medication Sig Start Date End Date Taking? Authorizing Provider  albuterol (VENTOLIN HFA) 108 (90 Base) MCG/ACT inhaler Inhale into the lungs every 6 (six) hours as needed for wheezing or shortness of breath.     [provider]  Cetirizine HCl 10 MG CAPS Take 1 capsule (10 mg total) by mouth daily. 02/11/19   Wieters, Hallie C, PA-C  fluticasone (FLONASE) 50 MCG/ACT nasal spray Place 1-2 sprays into both nostrils daily for 7 days. 02/11/19 02/18/19  Wieters, Hallie C, PA-C  ibuprofen (ADVIL) 600 MG tablet Take 1 tablet (600 mg total) by mouth every 6 (six) hours as needed. 02/11/19   Wieters, Junius Creamer, PA-C    Family History Family History  Problem Relation Age of Onset  . Healthy Mother   . Hypertension Father     Social History Social History   Tobacco Use  . Smoking status: Former Smoker    Packs/day: 0.25    Years: 3.00    Pack years: 0.75  . Smokeless tobacco: Never Used  Vaping Use  . Vaping Use: Never used  Substance Use Topics  . Alcohol use: Yes    Comment: occasional  . Drug use: No     Allergies   Patient has no known  allergies.   Review of Systems Review of Systems   Physical Exam Triage Vital Signs ED Triage Vitals  Enc Vitals Group     BP 09/20/19 1854 119/73     Pulse Rate 09/20/19 1854 (!) 127     Resp 09/20/19 1854 20     Temp 09/20/19 1854 100.2 F (37.9 C)     Temp Source 09/20/19 1854 Oral     SpO2 09/20/19 1854 100 %     Weight --      Height --      Head Circumference --      Peak Flow --      Pain Score 09/20/19 1852 7     Pain Loc --      Pain Edu? --      Excl. in GC? --    No data found.  Updated Vital Signs BP 119/73 (BP Location: Left Arm)   Pulse (!) 127   Temp 100.2 F (37.9 C) (Oral)   Resp 20   LMP 09/03/2019   SpO2 100%   Visual Acuity Right Eye Distance:   Left Eye Distance:   Bilateral Distance:    Right Eye Near:   Left Eye Near:    Bilateral Near:     Physical Exam Vitals and nursing note reviewed.  Constitutional:      General: She is not in acute distress.    Appearance: She is ill-appearing.     Comments: Laying in the fetal position on exam table in acute distress due to pain.  HENT:     Head:  Normocephalic and atraumatic.     Mouth/Throat:     Mouth: Mucous membranes are moist.     Pharynx: Oropharynx is clear.  Eyes:     Extraocular Movements: Extraocular movements intact.     Conjunctiva/sclera: Conjunctivae normal.     Pupils: Pupils are equal, round, and reactive to light.  Cardiovascular:     Rate and Rhythm: Regular rhythm. Tachycardia present.     Heart sounds: No murmur heard.   Pulmonary:     Effort: Pulmonary effort is normal. No respiratory distress.     Breath sounds: Normal breath sounds. No wheezing, rhonchi or rales.  Abdominal:     Palpations: Abdomen is soft.     Tenderness: There is abdominal tenderness (Severe tenderness throughout, focally in the lower abdomen.).  Musculoskeletal:     Cervical back: Neck supple. No rigidity.  Skin:    General: Skin is warm and dry.  Neurological:     Mental Status: She is alert.     Cranial Nerves: No dysarthria.     Comments: Patient states she believes she is in the hospital.  States that is Wednesday.  Otherwise oriented and alert.  Patient occasionally seems to faint out during interview and exam.      UC Treatments / Results  Labs (all labs ordered are listed, but only abnormal results are displayed) Labs Reviewed  SARS CORONAVIRUS 2 (TAT 6-24 HRS)    EKG Sinus tachycardia.  Radiology No results found.  Procedures Procedures (including critical care time)  Medications Ordered in UC Medications  sodium chloride 0.9 % bolus 1,000 mL (has no administration in time range)    Initial Impression / Assessment and Plan / UC Course  I have reviewed the triage vital signs and the nursing notes.  Pertinent labs & imaging results that were available during my care of the patient were reviewed by me and considered in my  medical decision making (see chart for details).     #Right headache #Tachycardia #Fever #Generalized abdominal pain #Lightheaded Patient is a 24 year old presenting with the  above symptoms.  Covid positive in January 2021.  Given severe symptoms in urgent care with significant abdominal tenderness, some change in mental status feel she needs further evaluation and work-up in the emergency department.  We will initiate normal saline bolus here, EKG with sinus tachycardia.  We will defer other work-up as it would not likely change her management presenting to the emergency department.  We will have the paramedics take her to [facility] given possible mental status changes and lightheadedness accompanying fever, tachycardia and abdominal pain.  Discussed this with the patient and she verbalized agreement understanding plan of care.   Final Clinical Impressions(s) / UC Diagnoses   Final diagnoses:  Bad headache  Tachycardia  Fever, unspecified  Generalized abdominal pain  Light headed     Discharge Instructions     Given all of your symptoms you need further evaluation in the Emergency Department. Due to your light headedness and needing treatment initiated here, we are call the Paramedics for you.     ED Prescriptions    None     PDMP not reviewed this encounter.   Hermelinda Medicus, PA-C 09/20/19 1927

## 2019-09-20 NOTE — ED Notes (Signed)
Patient is being discharged from the Urgent Care and sent to the Emergency Department via EMS. Per Faythe Dingwall, PA, patient is in need of higher level of care due to severe HA, tachycardia, weakness. Patient is aware and verbalizes understanding of plan of care.  Vitals:   09/20/19 1854  BP: 119/73  Pulse: (!) 127  Resp: 20  Temp: 100.2 F (37.9 C)  SpO2: 100%

## 2019-09-20 NOTE — Discharge Instructions (Signed)
Given all of your symptoms you need further evaluation in the Emergency Department. Due to your light headedness and needing treatment initiated here, we are call the Paramedics for you.

## 2019-09-20 NOTE — ED Triage Notes (Signed)
BIB EMS from [facility]. Patient reports subjective fevers, abd pain, gen weakness X1 day. Patient in NAD

## 2019-09-20 NOTE — ED Notes (Signed)
EMS called for transfer to ED

## 2019-09-20 NOTE — ED Notes (Signed)
Call report given to first nurse Brittani  At [facility] ED.

## 2019-09-21 LAB — SARS CORONAVIRUS 2 (TAT 6-24 HRS): SARS Coronavirus 2: NEGATIVE

## 2019-09-21 NOTE — ED Notes (Signed)
Called X3 to admin Tylenol with no answer. Not visualized in lobby

## 2020-02-18 ENCOUNTER — Ambulatory Visit
Admission: EM | Admit: 2020-02-18 | Discharge: 2020-02-18 | Disposition: A | Discharge status: Home / Self Care | Payer: 59 | Attending: Emergency Medicine | Admitting: Emergency Medicine

## 2020-02-18 ENCOUNTER — Encounter: Payer: Self-pay | Admitting: Emergency Medicine

## 2020-02-18 ENCOUNTER — Other Ambulatory Visit: Payer: Self-pay

## 2020-02-18 DIAGNOSIS — Z113 Encounter for screening for infections with a predominantly sexual mode of transmission: Secondary | ICD-10-CM | POA: Diagnosis present

## 2020-02-18 NOTE — Discharge Instructions (Signed)

## 2020-02-18 NOTE — ED Triage Notes (Signed)
Pt said partner has non-gonococcal urethritis per word of mouth but when test results were sent off they were negative. [Pt said no symptoms.

## 2020-02-18 NOTE — ED Provider Notes (Signed)
[facility]    CSN: [id number] Arrival date & time: 02/18/20  1348      History   Chief Complaint Chief Complaint  Patient presents with  . SEXUALLY TRANSMITTED DISEASE    HPI Sandra Morris is a 25 y.o. female  Presenting for STI testing.  Patient states that her current sexual partner, female, received treatment for gonorrhea at the [facility] department-verified by me with slip from health department.  States that his cytology came back today and was negative.  Patient currently asymptomatic.  Past Medical History:  Diagnosis Date  . Asthma   . Chlamydia   . Gonorrhea     There are no problems to display for this patient.   Past Surgical History:  Procedure Laterality Date  . WISDOM TOOTH EXTRACTION      OB History    Gravida  1   Para      Term      Preterm      AB      Living        SAB      IAB      Ectopic      Multiple      Live Births               Home Medications    Prior to Admission medications   Medication Sig Start Date End Date Taking? Authorizing Provider  albuterol (VENTOLIN HFA) 108 (90 Base) MCG/ACT inhaler Inhale into the lungs every 6 (six) hours as needed for wheezing or shortness of breath.    [provider]  Cetirizine HCl 10 MG CAPS Take 1 capsule (10 mg total) by mouth daily. 02/11/19   Wieters, Hallie C, PA-C  fluticasone (FLONASE) 50 MCG/ACT nasal spray Place 1-2 sprays into both nostrils daily for 7 days. 02/11/19 02/18/19  Wieters, Hallie C, PA-C  ibuprofen (ADVIL) 600 MG tablet Take 1 tablet (600 mg total) by mouth every 6 (six) hours as needed. 02/11/19   Wieters, Junius Creamer, PA-C    Family History Family History  Problem Relation Age of Onset  . Healthy Mother   . Hypertension Father     Social History Social History   Tobacco Use  . Smoking status: Former Smoker    Packs/day: 0.25    Years: 3.00    Pack years: 0.75  . Smokeless tobacco: Never Used  Vaping Use  . Vaping Use: Never  used  Substance Use Topics  . Alcohol use: Yes    Comment: occasional  . Drug use: No     Allergies   Patient has no known allergies.   Review of Systems Review of Systems  Constitutional: Negative for fatigue and fever.  Respiratory: Negative for cough and shortness of breath.   Cardiovascular: Negative for chest pain and palpitations.  Gastrointestinal: Negative for constipation and diarrhea.  Genitourinary: Negative for dysuria, flank pain, frequency, hematuria, pelvic pain, urgency, vaginal bleeding, vaginal discharge and vaginal pain.     Physical Exam Triage Vital Signs ED Triage Vitals  Enc Vitals Group     BP 02/18/20 1450 125/60     Pulse Rate 02/18/20 1450 78     Resp 02/18/20 1450 16     Temp 02/18/20 1450 98.6 F (37 C)     Temp Source 02/18/20 1450 Oral     SpO2 02/18/20 1450 100 %     Weight --      Height --      Head Circumference --  Peak Flow --      Pain Score 02/18/20 1447 0     Pain Loc --      Pain Edu? --      Excl. in GC? --    No data found.  Updated Vital Signs BP 125/60 (BP Location: Right Arm)   Pulse 78   Temp 98.6 F (37 C) (Oral)   Resp 16   LMP 02/13/2020   SpO2 100%   Visual Acuity Right Eye Distance:   Left Eye Distance:   Bilateral Distance:    Right Eye Near:   Left Eye Near:    Bilateral Near:     Physical Exam Constitutional:      General: She is not in acute distress. HENT:     Head: Normocephalic and atraumatic.  Eyes:     General: No scleral icterus.    Pupils: Pupils are equal, round, and reactive to light.  Cardiovascular:     Rate and Rhythm: Normal rate.  Pulmonary:     Effort: Pulmonary effort is normal.  Abdominal:     General: Bowel sounds are normal.     Palpations: Abdomen is soft.     Tenderness: There is no abdominal tenderness. There is no right CVA tenderness, left CVA tenderness or guarding.  Genitourinary:    Comments: Patient declined, self-swab performed Skin:    Coloration:  Skin is not jaundiced or pale.  Neurological:     Mental Status: She is alert and oriented to person, place, and time.      UC Treatments / Results  Labs (all labs ordered are listed, but only abnormal results are displayed) Labs Reviewed  CERVICOVAGINAL ANCILLARY ONLY    EKG   Radiology No results found.  Procedures Procedures (including critical care time)  Medications Ordered in UC Medications - No data to display  Initial Impression / Assessment and Plan / UC Course  I have reviewed the triage vital signs and the nursing notes.  Pertinent labs & imaging results that were available during my care of the patient were reviewed by me and considered in my medical decision making (see chart for details).     Cytology pending, will treat if indicated.  Return precautions discussed, pt verbalized understanding and is agreeable to plan. Final Clinical Impressions(s) / UC Diagnoses   Final diagnoses:  Screening examination for venereal disease     Discharge Instructions     Testing for chlamydia, gonorrhea, trichomonas is pending: please look for these results on the MyChart app/website.  We will notify you if you are positive and outline treatment at that time.  Important to avoid all forms of sexual intercourse (oral, vaginal, anal) with any/all partners for the next 7 days to avoid spreading/reinfecting. Any/all sexual partners should be notified of testing/treatment today.  Return for persistent/worsening symptoms or if you develop fever, abdominal or pelvic pain, discharge, genital pain, blood in your urine, or are re-exposed to an STI.    ED Prescriptions    None     PDMP not reviewed this encounter.   Hall-Potvin, [place], [place] 02/18/20 1459

## 2020-02-20 LAB — CERVICOVAGINAL ANCILLARY ONLY
Chlamydia: NEGATIVE
Comment: NEGATIVE
Comment: NEGATIVE
Comment: NORMAL
Neisseria Gonorrhea: NEGATIVE
Trichomonas: NEGATIVE
